# Patient Record
Sex: Female | Born: 1940 | Race: Black or African American | Hispanic: No | Smoking: Never smoker
Health system: Southern US, Community
[De-identification: ages and names within clinical notes are randomized; demographics above are authoritative.]

## PROBLEM LIST (undated history)

## (undated) DIAGNOSIS — I10 Essential (primary) hypertension: Secondary | ICD-10-CM

## (undated) DIAGNOSIS — R519 Headache, unspecified: Secondary | ICD-10-CM

## (undated) DIAGNOSIS — E7800 Pure hypercholesterolemia, unspecified: Secondary | ICD-10-CM

## (undated) DIAGNOSIS — G43909 Migraine, unspecified, not intractable, without status migrainosus: Secondary | ICD-10-CM

## (undated) DIAGNOSIS — K219 Gastro-esophageal reflux disease without esophagitis: Secondary | ICD-10-CM

## (undated) DIAGNOSIS — M1990 Unspecified osteoarthritis, unspecified site: Secondary | ICD-10-CM

## (undated) HISTORY — DX: Pure hypercholesterolemia, unspecified: E78.00

## (undated) HISTORY — DX: Migraine, unspecified, not intractable, without status migrainosus: G43.909

## (undated) HISTORY — DX: Essential (primary) hypertension: I10

## (undated) HISTORY — PX: TUBAL LIGATION: SHX77

## (undated) HISTORY — PX: TOTAL SHOULDER ARTHROPLASTY: SHX126

## (undated) HISTORY — DX: Headache, unspecified: R51.9

## (undated) HISTORY — DX: Gastro-esophageal reflux disease without esophagitis: K21.9

## (undated) HISTORY — DX: Unspecified osteoarthritis, unspecified site: M19.90

---

## 2015-06-05 DIAGNOSIS — I10 Essential (primary) hypertension: Secondary | ICD-10-CM | POA: Diagnosis present

## 2015-06-05 DIAGNOSIS — Z8673 Personal history of transient ischemic attack (TIA), and cerebral infarction without residual deficits: Secondary | ICD-10-CM | POA: Insufficient documentation

## 2015-06-05 DIAGNOSIS — E7800 Pure hypercholesterolemia, unspecified: Secondary | ICD-10-CM | POA: Insufficient documentation

## 2016-10-25 DIAGNOSIS — G4730 Sleep apnea, unspecified: Secondary | ICD-10-CM | POA: Insufficient documentation

## 2016-10-25 DIAGNOSIS — M1990 Unspecified osteoarthritis, unspecified site: Secondary | ICD-10-CM | POA: Insufficient documentation

## 2017-11-28 ENCOUNTER — Encounter: Payer: Self-pay | Admitting: Neurology

## 2018-02-03 NOTE — Progress Notes (Signed)
NEUROLOGY CONSULTATION NOTE  Destiny Barajas MRN: [id number] DOB: Jul 07, 1940  Referring provider: Craig StaggersSabitha Vasireddy, MD Primary care provider: Craig StaggersSabitha Vasireddy, MD  Reason for consult:  headaches  HISTORY OF PRESENT ILLNESS: Destiny Barajas is a 77 year old right-handed female with hypertension, OSA, hyperlipidemia, degenerative joint disease, ISG and history of TIA and right shoulder joint replacement who presents for headaches.  History supplemented by referring provider note.  Onset: She had similar headache back in 2011 or 2012.  At the time, she was evaluated in the ED.  CT of head was unremarkable.  They subsequently resolved.  Currently they started in March or April.  She was seen in the ED in May for headaches.  She has had some sinus congestion and has been taking Flonase and Zyrtec. Location:  Band-like distribution radiating down the neck bilaterally but may be unilateral either side Quality:  Pressure or throbbing Intensity:  Usually 6-7/10 (rarely up to 10/10).  She denies new headache, thunderclap headache or severe headache that wakes her from sleep. Aura:  no Prodrome:  no Postdrome:  no Associated symptoms:  None.  She denies associated nausea, vomiting, photophobia, phonophobia, visual disturbance or unilateral numbness or weakness. Duration:  4 hours Frequency:  Almost daily Frequency of abortive medication: 1 to 2 times a week Triggers:  unknown Relieving factors:  When preoccupied and thinking about something else Activity:  Able to function  Current NSAIDS: none Current analgesics:  Tylenol Current triptans:  none Current ergotamine:  none Current anti-emetic:  none Current muscle relaxants:  none Current anti-anxiolytic:  none Current sleep aide:  none Current Antihypertensive medications: Amlodipine 10 mg, valsartan-hydrochlorothiazide Current Antidepressant medications: None Current Anticonvulsant medications: None Current anti-CGRP: None Current  Vitamins/Herbal/Supplements: Vitamin D, calcium Current Antihistamines/Decongestants: Flonase, Zyrtec Other therapy: None  Past NSAIDS:  ASA 81mg  daily Past analgesics:  Tramadol (helped), oxycodone Past abortive triptans:  none Past abortive ergotamine:  none Past muscle relaxants:  none Past anti-emetic: Compazine, promethazine Past antihypertensive medications:  none Past antidepressant medications:  none Past anticonvulsant medications:  none Past anti-CGRP:  none Past vitamins/Herbal/Supplements:  none Past antihistamines/decongestants:  none Other past therapies:  none  Caffeine:  1 cup of coffee not daily Diet:  Not hydrating enough Exercise: No Depression: No; Anxiety: No Other pain: Right shoulder pain, pain in the knees, neck pain Sleep hygiene: Good Family history of headache:  Sister (migraines)  11/13/2017 labs: CBC with WBC 5.3, HMG 12.7, HCT 38.8, and PLT 211; CMP with NA 144, K4.7, CL 106, CO2 30, BUN 18, CR 1.04, T bili 0.6, ALP 71, AST 19, and ALT 9.  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 vitamin C (ASCORBIC ACID) 500 MG tablet Take 500 mg by mouth daily.  . Vitamin D, Ergocalciferol, (DRISDOL) 1.25 MG (50000 UT) CAPS capsule Take 50,000 Units by mouth every 7 (seven) days.  Marland Kitchen. amLODipine (NORVASC) 10 MG tablet Take 10 mg by mouth daily.  Marland Kitchen. atorvastatin (LIPITOR) 40 MG tablet Take 40 mg by mouth daily.  . valsartan-hydrochlorothiazide (DIOVAN-HCT) 160-12.5 MG tablet Take 1 tablet by mouth daily.   No facility-administered encounter medications on file as of 02/04/2018.     ALLERGIES: Compazine Promethazine  FAMILY HISTORY: Mother: CKD,  hypertension, breast cancer, stroke, dementia Father:  Hypertension, ischemic heart disease Brother: Ischemic heart disease  SOCIAL HISTORY: Marital status: Divorced Currently lives with her daughter Retired No history of tobacco, alcohol or recreational drug use  REVIEW OF SYSTEMS: Constitutional: No fevers, chills, or sweats, no generalized fatigue, change in appetite Eyes: No visual changes, double vision, eye pain Ear, nose and throat: No hearing loss, ear pain, nasal congestion, sore throat Cardiovascular: No chest pain, palpitations Respiratory:  No shortness of breath at rest or with exertion, wheezes GastrointestinaI: No nausea, vomiting, diarrhea, abdominal pain, fecal incontinence Genitourinary:  No dysuria, urinary retention or frequency Musculoskeletal: Neck pain Integumentary: No rash, pruritus, skin lesions Neurological: as above Psychiatric: No depression, insomnia, anxiety Endocrine: No palpitations, fatigue, diaphoresis, mood swings, change in appetite, change in weight, increased thirst Hematologic/Lymphatic:  No purpura, petechiae. Allergic/Immunologic: no itchy/runny eyes, nasal congestion, recent allergic reactions, rashes  PHYSICAL EXAM: Blood pressure 128/80, pulse 64, height 5' (1.524 m), weight 219 lb (99.3 kg), SpO2 92 %. General: No acute distress.  Patient appears well-groomed.  Head:  Normocephalic/atraumatic Eyes:  fundi examined but not visualized Neck: supple, mild bilateral paraspinal tenderness, full range of motion Back: No paraspinal tenderness Heart: regular rate and rhythm Lungs: Clear to auscultation bilaterally. Vascular: No carotid bruits. Neurological Exam: Mental status: alert and oriented to person, place, and time, recent and remote memory intact, fund of knowledge intact, attention and concentration intact, speech fluent and not dysarthric, language intact. Cranial nerves: CN I: not tested CN II: pupils equal, round and reactive to  light, visual fields intact CN III, IV, VI:  full range of motion, no nystagmus, no ptosis CN V: facial sensation intact CN VII: upper and lower face symmetric CN VIII: hearing intact CN IX, X: gag intact, uvula midline CN XI: sternocleidomastoid and trapezius muscles intact CN XII: tongue midline Bulk & Tone: normal, no fasciculations. Motor:  5/5 throughout  Sensation: temperature and vibration sensation intact. Deep Tendon Reflexes:  1+ throughout, toes downgoing.  Finger to nose testing:  Without dysmetria.  Heel to shin:  Without dysmetria.  Gait:  Normal station and stride.  Romberg negative.  IMPRESSION: Chronic tension-type headache, not intractable.  As she has prior history of these headaches with reportedly normal head CT, neurologic exam is unremarkable, semiology classic and they have currently been ongoing for several months, I don't feel further imaging is warranted at the time.    PLAN: 1.  For preventative management, she will start nortriptyline 10 mg at bedtime.  We can increase dose to 25 mg at bedtime in 4 weeks if needed.  Side effects discussed. 2.  For abortive therapy, she will try acetaminophen-caffeine. 3.  Limit use of pain relievers to no more than 2 days out of week to prevent risk of rebound or medication-overuse headache. 4.  Keep headache diary 5.  Exercise, hydration, caffeine cessation, sleep hygiene, monitor for and avoid triggers 6.  Consider:  magnesium citrate 400mg  daily, riboflavin 400mg  daily, and coenzyme Q10 100mg  three times daily 7. Suspicion is low given description of her headaches and prior history, but given her age and arthritis, we will check sed rate to evaluate for possible temporal arteritis. 8. Follow up in 3 to 4 months.  Thank you for allowing me to take part in the care of this patient.  Shon Millet, DO  CC: Craig Staggers, MD

## 2018-02-04 ENCOUNTER — Encounter

## 2018-02-04 ENCOUNTER — Encounter: Payer: Self-pay | Admitting: Neurology

## 2018-02-04 ENCOUNTER — Ambulatory Visit: Payer: Managed Care, Other (non HMO) | Admitting: Neurology

## 2018-02-04 ENCOUNTER — Other Ambulatory Visit (INDEPENDENT_AMBULATORY_CARE_PROVIDER_SITE_OTHER): Payer: Managed Care, Other (non HMO)

## 2018-02-04 VITALS — BP 128/80 | HR 64 | Ht 60.0 in | Wt 219.0 lb

## 2018-02-04 DIAGNOSIS — R6889 Other general symptoms and signs: Secondary | ICD-10-CM

## 2018-02-04 DIAGNOSIS — G44229 Chronic tension-type headache, not intractable: Secondary | ICD-10-CM | POA: Diagnosis not present

## 2018-02-04 LAB — SEDIMENTATION RATE: Sed Rate: 38 mm/hr — ABNORMAL HIGH (ref 0–30)

## 2018-02-04 MED ORDER — NORTRIPTYLINE HCL 10 MG PO CAPS: 10.0000 mg | ORAL_CAPSULE | Freq: Every day | ORAL | 3 refills | Status: DC

## 2018-02-04 NOTE — Patient Instructions (Addendum)
  1.  Start nortriptyline 10mg  at bedtime.  Contact me in 4 weeks with update and we can adjust dose if needed. 2.  Take acetaminophen-caffeine (Excedrin Tension) at earliest onset of headache.   3.  Limit use of pain relievers to no more than 2 days out of the week.  These medications include acetaminophen, ibuprofen, triptans and narcotics.  This will help reduce risk of rebound headaches. 4.  Be aware of common food triggers such as processed sweets, processed foods with nitrites (such as deli meat, hot dogs, sausages), foods with MSG, alcohol (such as wine), chocolate, certain cheeses, certain fruits (dried fruits, bananas, pineapple), vinegar, diet soda. 4.  Avoid caffeine 5.  Routine exercise 6.  Proper sleep hygiene 7.  Stay adequately hydrated with water 8.  Keep a headache diary. 9.  Maintain proper stress management. 10.  Do not skip meals. 11.  Consider supplements:  Magnesium citrate 400mg  to 600mg  daily, riboflavin 400mg , Coenzyme Q 10 100mg  three times daily 12.  Will check sed rate 13.  Follow up in 3 to 4 months.  Your provider has requested that you have labwork completed today. Please go to [facility] ([id number]) on the second floor of this building before leaving the office today. You do not need to check in. If you are not called within 15 minutes please check with the front desk.

## 2018-02-05 ENCOUNTER — Telehealth: Payer: Self-pay

## 2018-02-05 NOTE — Telephone Encounter (Signed)
-----   Message from Drema DallasAdam R Jaffe, DO sent at 02/04/2018  6:27 PM EST ----- Sed rate just mildly elevated but nothing significant or concerning

## 2018-02-05 NOTE — Telephone Encounter (Signed)
Called and advised Pt of lab results 

## 2018-02-27 ENCOUNTER — Other Ambulatory Visit: Payer: Self-pay | Admitting: Neurology

## 2018-06-09 ENCOUNTER — Ambulatory Visit: Payer: Managed Care, Other (non HMO) | Admitting: Neurology

## 2018-08-14 NOTE — Progress Notes (Signed)
Virtual Visit via Video Note The purpose of this virtual visit is to provide medical care while limiting exposure to the novel coronavirus.    Consent was obtained for video visit:  Yes.   Answered questions that patient had about telehealth interaction:  Yes.   I discussed the limitations, risks, security and privacy concerns of performing an evaluation and management service by telemedicine. I also discussed with the patient that there may be a patient responsible charge related to this service. The patient expressed understanding and agreed to proceed.  Pt location: Home Physician Location: Home Name of referring provider:  Dairl Ponder, MD I connected with Destiny Barajas at patients initiation/request on 08/17/2018 at 10:50 AM EDT by video enabled telemedicine application and verified that I am speaking with the correct person using two identifiers. Pt MRN:  [id number] Pt DOB:  August 16, 1940 Video Participants:  Destiny Barajas   History of Present Illness:  Destiny Barajas is a 78 year old right-handed female with hypertension, OSA, hyperlipidemia, degenerative joint disease, ISG and history of TIA and right shoulder joint replacement who follows up for headaches.  UPDATE: Sed rate from 02/04/18 was 38. She has not started nortriptyline because she read about possible suicidal ideation and was afraid. She said she went to an Urgent Care (not in system) and had a head CT that was reportedly unremarkable.  She was prescribed Fioricet.  They were ineffective.  Intensity:  Moderate-severe Duration:  4 to 5 hours Frequency:  4 days a week Frequency of abortive medication: 3 days a week Current NSAIDS: Excedrin Migraine (helps), Advil Migraine Current analgesics:  Fioricet Current triptans:  none Current ergotamine:  none Current anti-emetic:  none Current muscle relaxants:  none Current anti-anxiolytic:  none Current sleep aide:  none Current Antihypertensive medications:  Amlodipine 10 mg, valsartan-hydrochlorothiazide Current Antidepressant medications: Nortriptyline 10mg  at bedtime Current Anticonvulsant medications: None Current anti-CGRP: None Current Vitamins/Herbal/Supplements: Vitamin D, calcium Current Antihistamines/Decongestants: Flonase, Zyrtec Other therapy: None  Caffeine:  1 cup of coffee not daily Diet:  Not hydrating enough Exercise: No Depression: No; Anxiety: No Other pain: Right shoulder pain, pain in the knees, neck pain Sleep hygiene: Good  HISTORY: Onset: She had similar headache back in 2011 or 2012.  At the time, she was evaluated in the ED.  CT of head was unremarkable.  They subsequently resolved.  Currently they started in March or April.  She was seen in the ED in May for headaches.  She has had some sinus congestion and has been taking Flonase and Zyrtec. Location:  Band-like distribution radiating down the neck bilaterally but may be unilateral either side Quality:  Pressure or throbbing Initial intensity:  Usually 6-7/10 (rarely up to 10/10).  She denies new headache, thunderclap headache or severe headache that wakes her from sleep. Aura:  no Prodrome:  no Postdrome:  no Associated symptoms:  None.  She denies associated nausea, vomiting, photophobia, phonophobia, visual disturbance or unilateral numbness or weakness. Initial Duration:  4 hours Initial Frequency:  Almost daily Initial Frequency of abortive medication: 1 to 2 times a week Triggers:  Unknown Relieving factors:  When preoccupied and thinking about something else Activity:  Able to function  Past NSAIDS:  ASA 81mg  daily Past analgesics:  Tramadol (helped), oxycodone, Excedrin Past abortive triptans:  none Past abortive ergotamine:  none Past muscle relaxants:  none Past anti-emetic: Compazine, promethazine Past antihypertensive medications:  none Past antidepressant medications:  none Past anticonvulsant medications:  none Past anti-CGRP:  none  Past vitamins/Herbal/Supplements:  none Past antihistamines/decongestants:  none Other past therapies:  none  Family history of headache:  Sister (migraines)  Past Medical History: No past medical history on file.  Medications: Outpatient Encounter Medications as of 08/17/2018  Medication Sig  . amLODipine (NORVASC) 10 MG tablet Take 10 mg by mouth daily.  Marland Kitchen. atorvastatin (LIPITOR) 40 MG tablet Take 40 mg by mouth daily.  . nortriptyline (PAMELOR) 10 MG capsule TAKE 1 CAPSULE BY MOUTH EVERYDAY AT BEDTIME  . valsartan-hydrochlorothiazide (DIOVAN-HCT) 160-12.5 MG tablet Take 1 tablet by mouth daily.  . vitamin C (ASCORBIC ACID) 500 MG tablet Take 500 mg by mouth daily.  . Vitamin D, Ergocalciferol, (DRISDOL) 1.25 MG (50000 UT) CAPS capsule Take 50,000 Units by mouth every 7 (seven) days.   No facility-administered encounter medications on file as of 08/17/2018.     Allergies: Allergies  Allergen Reactions  . Compazine  [Prochlorperazine Edisylate]     Other reaction(s): Respiratory distress    Family History: Family History  Problem Relation Age of Onset  . Congestive Heart Failure Mother   . Breast cancer Mother   . Heart attack Father   . Brain cancer Brother   . Stomach cancer Brother   . High Cholesterol Brother   . Hypertension Brother   . Epilepsy Daughter   . Cerebral palsy Daughter   . Hypertension Daughter   . Diabetes Daughter   . Gout Son   . Hypertension Son   . High Cholesterol Son   . High Cholesterol Son     Social History: Social History   Socioeconomic History  . Marital status: Divorced    Spouse name: Not on file  . Number of children: 7  . Years of education: Not on file  . Highest education level: 11th grade  Occupational History  . Occupation: retired  Engineer, productionocial Needs  . Financial resource strain: Not on file  . Food insecurity    Worry: Not on file    Inability: Not on file  . Transportation needs    Medical: Not on file     Non-medical: Not on file  Tobacco Use  . Smoking status: Never Smoker  . Smokeless tobacco: Never Used  Substance and Sexual Activity  . Alcohol use: Never    Frequency: Never  . Drug use: Never  . Sexual activity: Not on file  Lifestyle  . Physical activity    Days per week: Not on file    Minutes per session: Not on file  . Stress: Not on file  Relationships  . Social Musicianconnections    Talks on phone: Not on file    Gets together: Not on file    Attends religious service: Not on file    Active member of club or organization: Not on file    Attends meetings of clubs or organizations: Not on file    Relationship status: Not on file  . Intimate partner violence    Fear of current or ex partner: Not on file    Emotionally abused: Not on file    Physically abused: Not on file    Forced sexual activity: Not on file  Other Topics Concern  . Not on file  Social History Narrative   Patient is right-handed. She lives in a one level home. She drinks one cup of coffee when it is cold weather, and an occasional tea or soda. She does not exercise due to pain in her knees.    Observations/Objective:  Height 5' (1.524 m), weight 225 lb (102.1 kg). No acute distress.  Alert and oriented.  Speech fluent and not dysarthric.  Language intact.  Face symmetric.  Assessment and Plan:   Questionable tension-type headache, not intractable.  Borderline elevated Sed rate suggests against temporal arteritis.  However, as these frequent headaches persist for the past 3 months, further imaging is warranted.  1. MRI of brain with and without contrast  2. For preventative management, start gabapentin 100mg  at bedtime for one week, then 100mg  twice daily for one week, then 100mg  in AM and 200mg  at bedtime for one week, then 200mg  twice daily 3.  For abortive therapy, stop Fioricet.  Continue Advil or Excedrin 4.  Limit use of pain relievers to no more than 2 days out of week to prevent risk of rebound or  medication-overuse headache. 5.  Keep headache diary 6.  Exercise, hydration, caffeine cessation, sleep hygiene, monitor for and avoid triggers 7.  Consider:  magnesium citrate 400mg  daily, riboflavin 400mg  daily, and coenzyme Q10 100mg  three times daily 8. Always keep in mind that currently taking a hormone or birth control may be a possible trigger or aggravating factor for migraine. 9. Follow up 4 months  Follow Up Instructions:    -I discussed the assessment and treatment plan with the patient. The patient was provided an opportunity to ask questions and all were answered. The patient agreed with the plan and demonstrated an understanding of the instructions.   The patient was advised to call back or seek an in-person evaluation if the symptoms worsen or if the condition fails to improve as anticipated.    Cira ServantAdam Robert Miranda Garber, DO

## 2018-08-17 ENCOUNTER — Encounter: Payer: Self-pay | Admitting: Neurology

## 2018-08-17 ENCOUNTER — Telehealth (INDEPENDENT_AMBULATORY_CARE_PROVIDER_SITE_OTHER): Payer: Medicare HMO | Admitting: Neurology

## 2018-08-17 ENCOUNTER — Other Ambulatory Visit: Payer: Self-pay

## 2018-08-17 VITALS — Ht 60.0 in | Wt 225.0 lb

## 2018-08-17 DIAGNOSIS — R519 Headache, unspecified: Secondary | ICD-10-CM

## 2018-08-17 DIAGNOSIS — R51 Headache: Secondary | ICD-10-CM | POA: Diagnosis not present

## 2018-08-17 MED ORDER — GABAPENTIN 100 MG PO CAPS: ORAL_CAPSULE | ORAL | 0 refills | Status: DC

## 2018-08-17 NOTE — Addendum Note (Signed)
Addended by: Clois Comber on: 08/17/2018 11:09 AM   Modules accepted: Orders

## 2018-08-18 ENCOUNTER — Ambulatory Visit: Payer: Managed Care, Other (non HMO) | Admitting: Neurology

## 2018-08-19 ENCOUNTER — Telehealth: Payer: Self-pay | Admitting: Neurology

## 2018-08-19 NOTE — Telephone Encounter (Signed)
Pt states that her insurance will only pay for 3 pills a day and he wanted her to take 4 a day. She states that she will need prior auth and her insurance states if they dont hear anything from [place] with 72 hours then the claim will be denied

## 2018-08-19 NOTE — Telephone Encounter (Signed)
Called Pt [phone] to confirm it is gabapentin she is calling about. She said she is willing to stay at a lower dose if it is not covered.   Called [employer] [facility] spoke with Hayden. Gave her clinical information. Approved PA# [id number] 02/18/18 - 02/18/19  Called and advised Pt of approval.

## 2018-09-08 ENCOUNTER — Ambulatory Visit
Admission: RE | Admit: 2018-09-08 | Discharge: 2018-09-08 | Disposition: A | Discharge status: Home / Self Care | Payer: Medicare HMO | Source: Ambulatory Visit | Attending: Neurology | Admitting: Neurology

## 2018-09-08 ENCOUNTER — Inpatient Hospital Stay: Admission: RE | Admit: 2018-09-08 | Payer: Medicare HMO | Source: Ambulatory Visit

## 2018-09-08 ENCOUNTER — Other Ambulatory Visit: Payer: Self-pay

## 2018-09-08 DIAGNOSIS — R519 Headache, unspecified: Secondary | ICD-10-CM

## 2018-09-08 MED ORDER — GADOPENTETATE DIMEGLUMINE 469.01 MG/ML IV SOLN
20.0000 mL | Freq: Once | INTRAVENOUS | Status: AC | PRN
  Administered 2018-09-08: 20 mL via INTRAVENOUS

## 2018-09-11 ENCOUNTER — Other Ambulatory Visit: Payer: Self-pay | Admitting: Neurology

## 2018-09-15 ENCOUNTER — Telehealth: Payer: Self-pay

## 2018-09-15 NOTE — Telephone Encounter (Signed)
-----   Message from Jesse Fall, RN sent at 09/15/2018 10:02 AM EDT -----  ----- Message ----- From: Pieter Partridge, DO Sent: 09/09/2018  12:59 PM EDT To: Clois Comber, CMA  MRI shows no tumors or anything specific to be causing headache.  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  Cannot determine when it occurred.  However, based on this finding, her age and her comorbidities (high blood pressure, cholesterol), I would start ASA 81mg  daily.

## 2018-09-15 NOTE — Telephone Encounter (Signed)
Called spoke with patient she was informed of results. Pt is currently taken 81 MG of ASA.

## 2018-10-29 NOTE — Progress Notes (Signed)
NEUROLOGY FOLLOW UP OFFICE NOTE  Destiny Barajas [id number]  HISTORY OF PRESENT ILLNESS: Destiny Barajas is a 40 year oldright-handed femalewith hypertension, OSA, hyperlipidemia, degenerative joint disease, ISG and history of TIA and right shoulder joint replacementwho follows up for headaches.  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  No mass lesions.  She was advised to start ASA 81mg  daily.  Intensity:  Moderate-severe Duration:  4 to 5 hours Frequency:  4 days a week Frequency of abortive medication: 3 days a week Current NSAIDS:Excedrin Migraine (helps), Advil Migraine, ASA 81mg  daily Current analgesics:none Current triptans:none Current ergotamine:none Current anti-emetic:none Current muscle relaxants:none Current anti-anxiolytic:none Current sleep aide:none Current Antihypertensive medications:Amlodipine 10 mg, valsartan-hydrochlorothiazide Current Antidepressant medications:None Current Anticonvulsant medications:Gabapentin 200mg  twice daily Current anti-CGRP:None Current Vitamins/Herbal/Supplements:Vitamin D, calcium Current Antihistamines/Decongestants:Flonase, Zyrtec Other therapy:None  Caffeine:1 cup of coffee not daily Diet:Not hydrating enough Exercise:No Depression:No; Anxiety:No Other pain:Right shoulder pain, pain in the knees, neck pain.  In the morning, she has soreness in the neck and shoulders that that improves with neck movement. Sleep hygiene:Good  HISTORY: Onset:She had similar headache back in 2011 or 2012. At the time, she was evaluated in the ED. CT of head was unremarkable. They subsequently resolved. They returned in March or April 2019. She was seen in the EDin Mayfor  headaches. She has had some sinus congestion and had been taking Flonase and Zyrtec.  Sed rate from 02/04/18 was 38. Location:Band-like distribution radiating down the neck bilaterally but may be unilateral either side Quality:Pressure or throbbing, she has reported shooting up the up to the jaw. Initial intensity:Usually 6-7/10 (rarely up to 10/10).Shedenies thunderclap headache or severe headache that wakes herfrom sleep. Aura:no Prodrome:no Postdrome:no Associated symptoms:  No associated symptoms.Shedenies associated nausea, vomiting, photophobia, phonophobia, visual disturbance orunilateral numbness or weakness. Initial Duration:4 hours Initial Frequency:Almost daily Initial Frequency of abortive medication:1 to 2 times a week Triggers:  Unknown Relieving factors:When preoccupied and thinking about something else Activity:Able to function  Past NSAIDS:ASA 81mg  daily Past analgesics:Fioricet (ineffective), tramadol (helped), oxycodone, Excedrin Past abortive triptans:none Past abortive ergotamine:none Past muscle relaxants:none Past anti-emetic:Compazine, promethazine Past antihypertensive medications:none Past antidepressant medications:none.  She did not want to try nortriptyline due to possible side effect of suicidal ideation. Past anticonvulsant medications:none Past anti-CGRP:none Past vitamins/Herbal/Supplements:none Past antihistamines/decongestants:none Other past therapies:none  Family history of headache:Sister (migraines)  PAST MEDICAL HISTORY: Past Medical History:  Diagnosis Date  . Acid reflux   . Arthritis   . Headache   . Hypercholesteremia   . Hypertension   . Migraines     MEDICATIONS: Current Outpatient Medications on File Prior to Visit  Medication Sig Dispense Refill  . amLODipine (NORVASC) 10 MG tablet Take 10 mg by mouth daily.    Marland Kitchen atorvastatin (LIPITOR) 40 MG tablet Take 40 mg by  mouth daily.    Marland Kitchen gabapentin (NEURONTIN) 100 MG capsule 2 CAPSULES TWICE A DAY 360 capsule 1  . nortriptyline (PAMELOR) 10 MG capsule TAKE 1 CAPSULE BY MOUTH EVERYDAY AT BEDTIME (Patient not taking: Reported on 08/17/2018) 90 capsule 2  . valsartan-hydrochlorothiazide (DIOVAN-HCT) 160-12.5 MG tablet Take 1 tablet by mouth daily.    . vitamin C (ASCORBIC ACID) 500 MG tablet Take 500 mg by mouth daily.    . Vitamin D, Ergocalciferol, (DRISDOL) 1.25 MG (50000 UT)  CAPS capsule Take 50,000 Units by mouth every 7 (seven) days.     No current facility-administered medications on file prior to visit.     ALLERGIES: Allergies  Allergen Reactions  . Prochlorperazine Edisylate Shortness Of Breath    Other reaction(s): Respiratory distress    FAMILY HISTORY: Family History  Problem Relation Age of Onset  . Congestive Heart Failure Mother   . Breast cancer Mother   . Heart attack Father   . Brain cancer Brother   . Stomach cancer Brother   . High Cholesterol Brother   . Hypertension Brother   . Epilepsy Daughter   . Cerebral palsy Daughter   . Hypertension Daughter   . Diabetes Daughter   . Gout Son   . Hypertension Son   . High Cholesterol Son   . High Cholesterol Son    SOCIAL HISTORY: Social History   Socioeconomic History  . Marital status: Divorced    Spouse name: Not on file  . Number of children: 7  . Years of education: Not on file  . Highest education level: 11th grade  Occupational History  . Occupation: retired  Engineer, productionocial Needs  . Financial resource strain: Not on file  . Food insecurity    Worry: Not on file    Inability: Not on file  . Transportation needs    Medical: Not on file    Non-medical: Not on file  Tobacco Use  . Smoking status: Never Smoker  . Smokeless tobacco: Never Used  Substance and Sexual Activity  . Alcohol use: Never    Frequency: Never  . Drug use: Never  . Sexual activity: Not on file  Lifestyle  . Physical activity    Days per week:  Not on file    Minutes per session: Not on file  . Stress: Not on file  Relationships  . Social Musicianconnections    Talks on phone: Not on file    Gets together: Not on file    Attends religious service: Not on file    Active member of club or organization: Not on file    Attends meetings of clubs or organizations: Not on file    Relationship status: Not on file  . Intimate partner violence    Fear of current or ex partner: Not on file    Emotionally abused: Not on file    Physically abused: Not on file    Forced sexual activity: Not on file  Other Topics Concern  . Not on file  Social History Narrative   Patient is right-handed. She lives in a one level home. She drinks one cup of coffee when it is cold weather, and an occasional tea or soda. She does not exercise due to pain in her knees.    REVIEW OF SYSTEMS: Constitutional: No fevers, chills, or sweats, no generalized fatigue, change in appetite Eyes: No visual changes, double vision, eye pain Ear, nose and throat: No hearing loss, ear pain, nasal congestion, sore throat Cardiovascular: No chest pain, palpitations Respiratory:  No shortness of breath at rest or with exertion, wheezes GastrointestinaI: No nausea, vomiting, diarrhea, abdominal pain, fecal incontinence Genitourinary:  No dysuria, urinary retention or frequency Musculoskeletal:  No neck pain, back pain Integumentary: No rash, pruritus, skin lesions Neurological: as above Psychiatric: No depression, insomnia, anxiety Endocrine: No palpitations, fatigue, diaphoresis, mood swings, change in appetite, change in weight, increased thirst Hematologic/Lymphatic:  No purpura, petechiae. Allergic/Immunologic: no itchy/runny eyes, nasal congestion, recent allergic reactions, rashes  PHYSICAL  EXAM: Blood pressure (!) 147/80, pulse (!) 56, temperature 97.6 F (36.4 C), height 5' (1.524 m), weight 228 lb (103.4 kg), SpO2 96 %. General: No acute distress.  Patient appears  well-groomed.   Head:  Normocephalic/atraumatic Eyes:  Fundi examined but not visualized Neck: supple, paraspinal tenderness, full range of motion Heart:  Regular rate and rhythm Lungs:  Clear to auscultation bilaterally Back: No paraspinal tenderness Neurological Exam: alert and oriented to person, place, and time. Attention span and concentration intact, recent and remote memory intact, fund of knowledge intact.  Speech fluent and not dysarthric, language intact.  CN II-XII intact. Bulk and tone normal, muscle strength 5/5 throughout.  Sensation to light touch  intact.  Deep tendon reflexes 2+ throughout.  Finger to nose testing intact.  Gait normal, Romberg negative.  IMPRESSION: Chronic headache, not intractable.  Unclear etiology.  May be tension-type or cervicogenic   PLAN: 1.  Due to severity of and persistence of pain, will check MRI of cervical spine 2.  Increase gabapentin to 300mg  twice daily 3.  Follow up in 4 months.  Shon Millet, DO  CC: Craig Staggers, MD

## 2018-10-30 ENCOUNTER — Encounter: Payer: Self-pay | Admitting: Neurology

## 2018-10-30 ENCOUNTER — Other Ambulatory Visit: Payer: Self-pay

## 2018-10-30 ENCOUNTER — Ambulatory Visit: Payer: Medicare HMO | Admitting: Neurology

## 2018-10-30 VITALS — BP 147/80 | HR 56 | Temp 97.6°F | Ht 60.0 in | Wt 228.0 lb

## 2018-10-30 DIAGNOSIS — R519 Headache, unspecified: Secondary | ICD-10-CM

## 2018-10-30 DIAGNOSIS — M542 Cervicalgia: Secondary | ICD-10-CM

## 2018-10-30 DIAGNOSIS — R51 Headache: Secondary | ICD-10-CM

## 2018-10-30 NOTE — Patient Instructions (Signed)
1.  We will increase dose of gabapentin 100mg  capsules:  Take 3 capsules twice daily.  Contact me for refill and I will prescribe 300mg  capsules.  We can still increase dose if needed after 4 to 6 weeks. 2.  We will order MRI of cervical spine without contrast 3.  Follow up in 4 months.

## 2018-11-15 ENCOUNTER — Other Ambulatory Visit: Payer: Self-pay

## 2018-11-15 ENCOUNTER — Ambulatory Visit
Admission: RE | Admit: 2018-11-15 | Discharge: 2018-11-15 | Disposition: A | Discharge status: Home / Self Care | Payer: Medicare HMO | Source: Ambulatory Visit | Attending: Neurology | Admitting: Neurology

## 2018-11-15 DIAGNOSIS — R519 Headache, unspecified: Secondary | ICD-10-CM

## 2018-11-15 DIAGNOSIS — M542 Cervicalgia: Secondary | ICD-10-CM

## 2018-11-30 ENCOUNTER — Telehealth: Payer: Self-pay | Admitting: *Deleted

## 2018-11-30 NOTE — Telephone Encounter (Signed)
Patient called and had not heard results of her MRI cervical spine.   Per MD notes :  Notes recorded by Pieter Partridge, DO on 11/16/2018 at 10:34 AM EDT    MRI of cervical spine shows some arthritic changes. No findings that require any surgical intervention or anything like that.  Read result note above to patient and she verbalized understanding.

## 2018-12-29 ENCOUNTER — Ambulatory Visit: Payer: Medicare HMO | Admitting: Neurology

## 2019-02-26 ENCOUNTER — Encounter: Payer: Self-pay | Admitting: Neurology

## 2019-03-01 NOTE — Progress Notes (Signed)
Appointment was cancelled.

## 2019-03-02 ENCOUNTER — Telehealth (INDEPENDENT_AMBULATORY_CARE_PROVIDER_SITE_OTHER): Payer: Medicare HMO | Admitting: Neurology

## 2019-06-15 ENCOUNTER — Ambulatory Visit: Payer: Medicare HMO | Admitting: Neurology

## 2021-01-15 ENCOUNTER — Emergency Department (HOSPITAL_COMMUNITY): Payer: Medicare HMO

## 2021-01-15 ENCOUNTER — Encounter (HOSPITAL_COMMUNITY): Payer: Self-pay | Admitting: Emergency Medicine

## 2021-01-15 ENCOUNTER — Inpatient Hospital Stay (HOSPITAL_COMMUNITY)
Admission: EM | Admit: 2021-01-15 | Discharge: 2021-01-19 | DRG: 291 | Disposition: A | Discharge status: Home / Self Care | Payer: Medicare HMO | Attending: Internal Medicine | Admitting: Internal Medicine

## 2021-01-15 DIAGNOSIS — R0602 Shortness of breath: Secondary | ICD-10-CM | POA: Diagnosis present

## 2021-01-15 DIAGNOSIS — I5033 Acute on chronic diastolic (congestive) heart failure: Secondary | ICD-10-CM | POA: Diagnosis not present

## 2021-01-15 DIAGNOSIS — Z888 Allergy status to other drugs, medicaments and biological substances status: Secondary | ICD-10-CM

## 2021-01-15 DIAGNOSIS — I110 Hypertensive heart disease with heart failure: Secondary | ICD-10-CM | POA: Diagnosis not present

## 2021-01-15 DIAGNOSIS — I5031 Acute diastolic (congestive) heart failure: Secondary | ICD-10-CM

## 2021-01-15 DIAGNOSIS — Z87892 Personal history of anaphylaxis: Secondary | ICD-10-CM

## 2021-01-15 DIAGNOSIS — M7989 Other specified soft tissue disorders: Secondary | ICD-10-CM

## 2021-01-15 DIAGNOSIS — Z9851 Tubal ligation status: Secondary | ICD-10-CM

## 2021-01-15 DIAGNOSIS — Z79899 Other long term (current) drug therapy: Secondary | ICD-10-CM

## 2021-01-15 DIAGNOSIS — Z833 Family history of diabetes mellitus: Secondary | ICD-10-CM

## 2021-01-15 DIAGNOSIS — Z6841 Body Mass Index (BMI) 40.0 and over, adult: Secondary | ICD-10-CM

## 2021-01-15 DIAGNOSIS — E782 Mixed hyperlipidemia: Secondary | ICD-10-CM | POA: Diagnosis present

## 2021-01-15 DIAGNOSIS — K219 Gastro-esophageal reflux disease without esophagitis: Secondary | ICD-10-CM | POA: Diagnosis present

## 2021-01-15 DIAGNOSIS — Z20822 Contact with and (suspected) exposure to covid-19: Secondary | ICD-10-CM | POA: Diagnosis present

## 2021-01-15 DIAGNOSIS — Z8249 Family history of ischemic heart disease and other diseases of the circulatory system: Secondary | ICD-10-CM

## 2021-01-15 DIAGNOSIS — E1142 Type 2 diabetes mellitus with diabetic polyneuropathy: Secondary | ICD-10-CM | POA: Diagnosis present

## 2021-01-15 DIAGNOSIS — L409 Psoriasis, unspecified: Secondary | ICD-10-CM | POA: Diagnosis present

## 2021-01-15 DIAGNOSIS — Z8673 Personal history of transient ischemic attack (TIA), and cerebral infarction without residual deficits: Secondary | ICD-10-CM

## 2021-01-15 DIAGNOSIS — E66813 Obesity, class 3: Secondary | ICD-10-CM | POA: Diagnosis present

## 2021-01-15 DIAGNOSIS — I10 Essential (primary) hypertension: Secondary | ICD-10-CM | POA: Diagnosis present

## 2021-01-15 DIAGNOSIS — K449 Diaphragmatic hernia without obstruction or gangrene: Secondary | ICD-10-CM

## 2021-01-15 DIAGNOSIS — M1990 Unspecified osteoarthritis, unspecified site: Secondary | ICD-10-CM | POA: Diagnosis present

## 2021-01-15 DIAGNOSIS — R0600 Dyspnea, unspecified: Secondary | ICD-10-CM

## 2021-01-15 DIAGNOSIS — Z83438 Family history of other disorder of lipoprotein metabolism and other lipidemia: Secondary | ICD-10-CM

## 2021-01-15 DIAGNOSIS — R7989 Other specified abnormal findings of blood chemistry: Secondary | ICD-10-CM | POA: Diagnosis present

## 2021-01-15 DIAGNOSIS — G4733 Obstructive sleep apnea (adult) (pediatric): Secondary | ICD-10-CM | POA: Diagnosis present

## 2021-01-15 LAB — D-DIMER, QUANTITATIVE: D-Dimer, Quant: 3.18 ug/mL-FEU — ABNORMAL HIGH (ref 0.00–0.50)

## 2021-01-15 LAB — BLOOD GAS, VENOUS
Acid-Base Excess: 3.3 mmol/L — ABNORMAL HIGH (ref 0.0–2.0)
Bicarbonate: 25.7 mmol/L (ref 20.0–28.0)
Drawn by: 6381
FIO2: 21
O2 Saturation: 37.2 %
Patient temperature: 36.6
pCO2, Ven: 45.6 mmHg (ref 44.0–60.0)
pH, Ven: 7.4 (ref 7.250–7.430)
pO2, Ven: <31 mmHg — CL (ref 32.0–45.0)

## 2021-01-15 LAB — BASIC METABOLIC PANEL
Anion gap: 9 (ref 5–15)
BUN: 10 mg/dL (ref 8–23)
CO2: 27 mmol/L (ref 22–32)
Calcium: 9.6 mg/dL (ref 8.9–10.3)
Chloride: 106 mmol/L (ref 98–111)
Creatinine, Ser: 1.32 mg/dL — ABNORMAL HIGH (ref 0.44–1.00)
GFR, Estimated: 41 mL/min — ABNORMAL LOW (ref >60)
Glucose, Bld: 115 mg/dL — ABNORMAL HIGH (ref 70–99)
Potassium: 4.1 mmol/L (ref 3.5–5.1)
Sodium: 142 mmol/L (ref 135–145)

## 2021-01-15 LAB — CBC
HCT: 39 % (ref 36.0–46.0)
Hemoglobin: 12.1 g/dL (ref 12.0–15.0)
MCH: 28.7 pg (ref 26.0–34.0)
MCHC: 31 g/dL (ref 30.0–36.0)
MCV: 92.4 fL (ref 80.0–100.0)
Platelets: 224 10*3/uL (ref 150–400)
RBC: 4.22 MIL/uL (ref 3.87–5.11)
RDW: 15.7 % — ABNORMAL HIGH (ref 11.5–15.5)
WBC: 8.9 10*3/uL (ref 4.0–10.5)
nRBC: 0 % (ref 0.0–0.2)

## 2021-01-15 LAB — TROPONIN I (HIGH SENSITIVITY)
Troponin I (High Sensitivity): 11 ng/L (ref <18)
Troponin I (High Sensitivity): 10 ng/L (ref <18)

## 2021-01-15 LAB — RESP PANEL BY RT-PCR (FLU A&B, COVID) ARPGX2
Influenza A by PCR: NEGATIVE
Influenza B by PCR: NEGATIVE
SARS Coronavirus 2 by RT PCR: NEGATIVE

## 2021-01-15 LAB — BRAIN NATRIURETIC PEPTIDE: B Natriuretic Peptide: 79 pg/mL (ref 0.0–100.0)

## 2021-01-15 MED ORDER — IOHEXOL 350 MG/ML SOLN
100.0000 mL | Freq: Once | INTRAVENOUS | Status: AC | PRN
  Administered 2021-01-15: 21:00:00 100 mL via INTRAVENOUS

## 2021-01-15 MED ORDER — FUROSEMIDE 10 MG/ML IJ SOLN
40.0000 mg | Freq: Once | INTRAMUSCULAR | Status: DC
  Filled 2021-01-15: qty 4

## 2021-01-15 NOTE — ED Notes (Signed)
Pt with notable blood pressure discrepancy in right vs left arms. Dr.Goldston notified. Lasix held.

## 2021-01-15 NOTE — ED Notes (Signed)
Pt ambulated to bathroom on continuous pulse oximeter with sats maintaining 92-93% and HR 85-99bpm. On return to room pts sats dropped to 89% with RR 36-38bpm HR in 90s. Pt returned to 92-95% after 3-5 minutes but continues with increased work of breathing. Blood pressure discrepancy noted in left vs. right arm again. Dr. Criss Alvine aware.

## 2021-01-15 NOTE — ED Notes (Signed)
Report called to Tia RN on 300

## 2021-01-15 NOTE — ED Provider Notes (Signed)
[facility] EMERGENCY DEPARTMENT Provider Note   CSN: [id number] Arrival date & time: 01/15/21  1545     History Chief Complaint  Patient presents with   Shortness of Breath    Destiny Barajas is a 80 y.o. female.  HPI 80 year old female presents with shortness of breath.  Its been going on for about 2 weeks but worse over the last 4 days or so.  No cough, fever, chest pain.  She is been having bilateral lower extremity edema.  She was put on Lasix and feels like the swelling temporarily gets better but then gets worse again.  The shortness of breath is most prominent when she is ambulating and she gets winded quite easily.  She denies any known history of CHF.  Past Medical History:  Diagnosis Date   Acid reflux    Arthritis    Headache    Hypercholesteremia    Hypertension    Migraines     Patient Active Problem List   Diagnosis Date Noted   Shortness of breath 01/15/2021   Arthritis 10/25/2016   Sleep apnea 10/25/2016   High cholesterol 06/05/2015   History of TIA (transient ischemic attack) 06/05/2015   Hypertension 06/05/2015    Past Surgical History:  Procedure Laterality Date   TOTAL SHOULDER ARTHROPLASTY Right    TUBAL LIGATION       OB History   No obstetric history on file.     Family History  Problem Relation Age of Onset   Congestive Heart Failure Mother    Breast cancer Mother    Heart attack Father    Brain cancer Brother    Stomach cancer Brother    High Cholesterol Brother    Hypertension Brother    Epilepsy Daughter    Cerebral palsy Daughter    Hypertension Daughter    Diabetes Daughter    Gout Son    Hypertension Son    High Cholesterol Son    High Cholesterol Son     Social History   Tobacco Use   Smoking status: Never   Smokeless tobacco: Never  Vaping Use   Vaping Use: Never used  Substance Use Topics   Alcohol use: Never   Drug use: Never    Home Medications Prior to Admission medications   Medication Sig Start  Date End Date Taking? Authorizing Provider  amLODipine (NORVASC) 10 MG tablet Take 10 mg by mouth daily.   Yes [provider]  Apremilast (OTEZLA PO) Take 30 mg by mouth 2 (two) times daily.   Yes [provider]  atorvastatin (LIPITOR) 40 MG tablet Take 20 mg by mouth daily.   Yes [provider]  Biotin 93818 MCG TABS Take 1 tablet by mouth daily.   Yes [provider]  calcium carbonate (OS-CAL) 1250 (500 Ca) MG chewable tablet Chew 1 tablet by mouth daily. Not sure of dose but takes daily OTC calcium   Yes [provider]  cholecalciferol (VITAMIN D3) 25 MCG (1000 UT) tablet Take 1,000 Units by mouth daily.   Yes [provider]  Fluocinolone Acetonide Scalp 0.01 % OIL Apply topically at bedtime. 12/11/20  Yes [provider]  fluticasone (CUTIVATE) 0.05 % cream Apply 1 application topically daily. 12/29/20  Yes [provider]  furosemide (LASIX) 20 MG tablet Take 20 mg by mouth daily. 12/26/20  Yes [provider]  valsartan (DIOVAN) 160 MG tablet Take 160 mg by mouth daily. 09/01/18  Yes [provider]  vitamin C (  ASCORBIC ACID) 500 MG tablet Take 500 mg by mouth daily.   Yes [provider]  clobetasol cream (TEMOVATE) 0.05 % Apply topically. Patient not taking: Reported on 01/15/2021 12/18/20   [provider]  gabapentin (NEURONTIN) 100 MG capsule 2 CAPSULES TWICE A DAY Patient not taking: Reported on 02/26/2019 09/11/18   Drema Dallas, DO  hydrochlorothiazide (HYDRODIURIL) 12.5 MG tablet TAKE 1 TABLET BY MOUTH EVERY DAY IN THE MORNING Patient not taking: Reported on 01/15/2021 08/24/18   [provider]  ketoconazole (NIZORAL) 2 % shampoo Apply topically 2 (two) times a week. Patient not taking: Reported on 01/15/2021 11/06/20   [provider]  nortriptyline (PAMELOR) 10 MG capsule TAKE 1 CAPSULE BY MOUTH EVERYDAY AT BEDTIME Patient not taking: No sig reported  02/27/18   Drema Dallas, DO  triamcinolone ointment (KENALOG) 0.1 % Apply 1 application topically 2 (two) times daily. Patient not taking: Reported on 01/15/2021 11/29/20   [provider]    Allergies    Prochlorperazine and Prochlorperazine edisylate  Review of Systems   Review of Systems  Constitutional:  Negative for fever.  Respiratory:  Positive for shortness of breath. Negative for cough.   Cardiovascular:  Positive for leg swelling. Negative for chest pain.  Gastrointestinal:  Negative for abdominal pain.  All other systems reviewed and are negative.  Physical Exam Updated Vital Signs BP (!) 111/98 (BP Location: Left Arm)   Pulse 72   Temp 98.3 F (36.8 C)   Resp 20   SpO2 95%   Physical Exam Vitals and nursing note reviewed.  Constitutional:      Appearance: She is well-developed. She is obese.  HENT:     Head: Normocephalic and atraumatic.     Right Ear: External ear normal.     Left Ear: External ear normal.     Nose: Nose normal.  Eyes:     General:        Right eye: No discharge.        Left eye: No discharge.  Cardiovascular:     Rate and Rhythm: Normal rate and regular rhythm.     Heart sounds: Normal heart sounds.  Pulmonary:     Effort: Pulmonary effort is normal.     Breath sounds: Examination of the left-lower field reveals decreased breath sounds. Decreased breath sounds present.  Abdominal:     Palpations: Abdomen is soft.     Tenderness: There is no abdominal tenderness.  Musculoskeletal:     Right lower leg: Edema present.     Left lower leg: Edema present.  Skin:    General: Skin is warm and dry.  Neurological:     Mental Status: She is alert.  Psychiatric:        Mood and Affect: Mood is not anxious.    ED Results / Procedures / Treatments   Labs (all labs ordered are listed, but only abnormal results are displayed) Labs Reviewed  BASIC METABOLIC PANEL - Abnormal; Notable for the following components:      Result Value    Glucose, Bld 115 (*)    Creatinine, Ser 1.32 (*)    GFR, Estimated 41 (*)    All other components within normal limits  CBC - Abnormal; Notable for the following components:   RDW 15.7 (*)    All other components within normal limits  D-DIMER, QUANTITATIVE - Abnormal; Notable for the following components:   D-Dimer, Quant 3.18 (*)    All other components  within normal limits  BLOOD GAS, VENOUS - Abnormal; Notable for the following components:   pO2, Ven <31.0 (*)    Acid-Base Excess 3.3 (*)    All other components within normal limits  RESP PANEL BY RT-PCR (FLU A&B, COVID) ARPGX2  BRAIN NATRIURETIC PEPTIDE  TROPONIN I (HIGH SENSITIVITY)  TROPONIN I (HIGH SENSITIVITY)    EKG EKG Interpretation  Date/Time:  Monday January 15 2021 16:25:31 EST Ventricular Rate:  80 PR Interval:  154 QRS Duration: 78 QT Interval:  378 QTC Calculation: 435 R Axis:   36 Text Interpretation: Normal sinus rhythm nonspecific T waves No old tracing to compare Confirmed by Pricilla Loveless [phone]) on 01/15/2021 7:52:26 PM  Radiology DG Chest 2 View  Result Date: 01/15/2021 CLINICAL DATA:  Shortness of breath. EXAM: CHEST - 2 VIEW COMPARISON:  None. FINDINGS: There is a moderate-sized left diaphragmatic hernia and large hiatal hernia. There is no definite lung consolidation, pleural effusion or pneumothorax identified. Cardiomediastinal silhouette is grossly within normal limits. No acute fractures are seen. There is a right shoulder arthroplasty. IMPRESSION: 1. There is a moderate-sized left diaphragmatic hernia and large hiatal hernia. This may be contributing to patient's shortness of breath. 2. There is no definite focal lung infiltrate. Electronically Signed   By: Darliss Cheney M.D.   On: 01/15/2021 17:27   CT Angio Chest PE W and/or Wo Contrast  Result Date: 01/15/2021 CLINICAL DATA:  PE suspected positive D-dimer EXAM: CT ANGIOGRAPHY CHEST WITH CONTRAST TECHNIQUE: Multidetector CT imaging of  the chest was performed using the standard protocol during bolus administration of intravenous contrast. Multiplanar CT image reconstructions and MIPs were obtained to evaluate the vascular anatomy. CONTRAST:  OMNIPAQUE IOHEXOL 350 MG/ML SOLN COMPARISON:  None. FINDINGS: Cardiovascular: No filling defects within the pulmonary arteries to suggest acute pulmonary embolism. Suboptimal exam due to body habitus. Mediastinum/Nodes: No axillary or supraclavicular adenopathy. No mediastinal or hilar adenopathy. No pericardial fluid. Esophagus normal. Large diaphragmatic hernia with the near entirety of the stomach and the transverse colon above the hemidiaphragms posterior the heart. Lungs/Pleura: No pulmonary infarction. No pulmonary edema. Mild LEFT basilar atelectasis associated to the elevated hemidiaphragm. Upper Abdomen: Limited view of the liver, kidneys, pancreas are unremarkable. Normal adrenal glands. Large hiatal hernia as above. Musculoskeletal: No aggressive osseous lesion identified. Review of the MIP images confirms the above findings. IMPRESSION: 1. No acute pulmonary embolism. Suboptimal exam due to body habitus. 2. 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 Electronically Signed   By: Genevive Bi M.D.   On: 01/15/2021 21:44    Procedures Procedures   Medications Ordered in ED Medications  furosemide (LASIX) injection 40 mg (0 mg Intravenous Hold 01/15/21 2149)  iohexol (OMNIPAQUE) 350 MG/ML injection 100 mL (100 mLs Intravenous Contrast Given 01/15/21 2124)    ED Course  I have reviewed the triage vital signs and the nursing notes.  Pertinent labs & imaging results that were available during my care of the patient were reviewed by me and considered in my medical decision making (see chart for details).    MDM Rules/Calculators/A&P                           Unclear exact cause of dyspnea.  Workup shows no focal findings besides sizable hiatal hernia. Perhaps this is causing her dyspnea. She became mildly hypoxic with ambulation (89%) and was quite  symptomatic. Given this, will admit for further workup.  Final Clinical Impression(s) / ED Diagnoses Final diagnoses:  Dyspnea, unspecified type    Rx / DC Orders ED Discharge Orders     None        Pricilla Loveless, MD 01/16/21 [phone]

## 2021-01-15 NOTE — ED Notes (Signed)
Date and time results received: 01/15/21 2325 (use smartphrase ".now" to insert current time)  Test: PO2 Critical Value: <31  Name of Provider Notified: Dr. Leafy Half  Orders Received? Or Actions Taken?: no/na

## 2021-01-15 NOTE — ED Triage Notes (Signed)
Pt here from home with c/o sob , worse with movement , pt was just placed on lasix 1 week ago , no cp

## 2021-01-15 NOTE — ED Notes (Signed)
Pt ambulated in hallway by staff x2  O2 92-93% during ambulation HR 95-98

## 2021-01-16 ENCOUNTER — Observation Stay (HOSPITAL_COMMUNITY): Payer: Medicare HMO

## 2021-01-16 ENCOUNTER — Other Ambulatory Visit: Payer: Self-pay

## 2021-01-16 DIAGNOSIS — R0609 Other forms of dyspnea: Secondary | ICD-10-CM

## 2021-01-16 DIAGNOSIS — K219 Gastro-esophageal reflux disease without esophagitis: Secondary | ICD-10-CM

## 2021-01-16 DIAGNOSIS — R7989 Other specified abnormal findings of blood chemistry: Secondary | ICD-10-CM | POA: Diagnosis not present

## 2021-01-16 DIAGNOSIS — E782 Mixed hyperlipidemia: Secondary | ICD-10-CM

## 2021-01-16 DIAGNOSIS — R0602 Shortness of breath: Secondary | ICD-10-CM | POA: Diagnosis not present

## 2021-01-16 DIAGNOSIS — I10 Essential (primary) hypertension: Secondary | ICD-10-CM | POA: Diagnosis not present

## 2021-01-16 DIAGNOSIS — L409 Psoriasis, unspecified: Secondary | ICD-10-CM

## 2021-01-16 DIAGNOSIS — K449 Diaphragmatic hernia without obstruction or gangrene: Secondary | ICD-10-CM

## 2021-01-16 LAB — ECHOCARDIOGRAM COMPLETE
AR max vel: 2.12 cm2
AV Area VTI: 2.61 cm2
AV Area mean vel: 2.14 cm2
AV Mean grad: 7 mmHg
AV Peak grad: 16.3 mmHg
Ao pk vel: 2.02 m/s
Area-P 1/2: 2.81 cm2
Calc EF: 63.8 %
Height: 60 in
MV VTI: 2.4 cm2
S' Lateral: 3.1 cm
Single Plane A2C EF: 54.5 %
Single Plane A4C EF: 69.2 %
Weight: 3453.29 oz

## 2021-01-16 LAB — PROTIME-INR
INR: 1.1 (ref 0.8–1.2)
Prothrombin Time: 14.1 seconds (ref 11.4–15.2)

## 2021-01-16 LAB — CBC WITH DIFFERENTIAL/PLATELET
Abs Immature Granulocytes: 0.06 10*3/uL (ref 0.00–0.07)
Basophils Absolute: 0.1 10*3/uL (ref 0.0–0.1)
Basophils Relative: 1 %
Eosinophils Absolute: 0.8 10*3/uL — ABNORMAL HIGH (ref 0.0–0.5)
Eosinophils Relative: 12 %
HCT: 35.4 % — ABNORMAL LOW (ref 36.0–46.0)
Hemoglobin: 10.8 g/dL — ABNORMAL LOW (ref 12.0–15.0)
Immature Granulocytes: 1 %
Lymphocytes Relative: 20 %
Lymphs Abs: 1.4 10*3/uL (ref 0.7–4.0)
MCH: 28.4 pg (ref 26.0–34.0)
MCHC: 30.5 g/dL (ref 30.0–36.0)
MCV: 93.2 fL (ref 80.0–100.0)
Monocytes Absolute: 0.6 10*3/uL (ref 0.1–1.0)
Monocytes Relative: 9 %
Neutro Abs: 3.9 10*3/uL (ref 1.7–7.7)
Neutrophils Relative %: 57 %
Platelets: 184 10*3/uL (ref 150–400)
RBC: 3.8 MIL/uL — ABNORMAL LOW (ref 3.87–5.11)
RDW: 15.7 % — ABNORMAL HIGH (ref 11.5–15.5)
WBC: 6.8 10*3/uL (ref 4.0–10.5)
nRBC: 0 % (ref 0.0–0.2)

## 2021-01-16 LAB — COMPREHENSIVE METABOLIC PANEL
ALT: 15 U/L (ref 0–44)
AST: 22 U/L (ref 15–41)
Albumin: 3.1 g/dL — ABNORMAL LOW (ref 3.5–5.0)
Alkaline Phosphatase: 43 U/L (ref 38–126)
Anion gap: 8 (ref 5–15)
BUN: 8 mg/dL (ref 8–23)
CO2: 26 mmol/L (ref 22–32)
Calcium: 9.3 mg/dL (ref 8.9–10.3)
Chloride: 109 mmol/L (ref 98–111)
Creatinine, Ser: 1.08 mg/dL — ABNORMAL HIGH (ref 0.44–1.00)
GFR, Estimated: 52 mL/min — ABNORMAL LOW (ref >60)
Glucose, Bld: 86 mg/dL (ref 70–99)
Potassium: 4.1 mmol/L (ref 3.5–5.1)
Sodium: 143 mmol/L (ref 135–145)
Total Bilirubin: 1.2 mg/dL (ref 0.3–1.2)
Total Protein: 5.7 g/dL — ABNORMAL LOW (ref 6.5–8.1)

## 2021-01-16 LAB — APTT: aPTT: 32 seconds (ref 24–36)

## 2021-01-16 LAB — MAGNESIUM: Magnesium: 1.8 mg/dL (ref 1.7–2.4)

## 2021-01-16 MED ORDER — POLYETHYLENE GLYCOL 3350 17 G PO PACK: 17.0000 g | PACK | Freq: Every day | ORAL | Status: DC | PRN

## 2021-01-16 MED ORDER — ONDANSETRON HCL 4 MG PO TABS: 4.0000 mg | ORAL_TABLET | Freq: Four times a day (QID) | ORAL | Status: DC | PRN

## 2021-01-16 MED ORDER — ACETAMINOPHEN 325 MG PO TABS
650.0000 mg | ORAL_TABLET | Freq: Four times a day (QID) | ORAL | Status: DC | PRN
  Administered 2021-01-18: 650 mg via ORAL
  Filled 2021-01-16: qty 2

## 2021-01-16 MED ORDER — ONDANSETRON HCL 4 MG/2ML IJ SOLN: 4.0000 mg | Freq: Four times a day (QID) | INTRAMUSCULAR | Status: DC | PRN

## 2021-01-16 MED ORDER — ACETAMINOPHEN 650 MG RE SUPP: 650.0000 mg | Freq: Four times a day (QID) | RECTAL | Status: DC | PRN

## 2021-01-16 MED ORDER — HYDRALAZINE HCL 20 MG/ML IJ SOLN: 10.0000 mg | Freq: Four times a day (QID) | INTRAMUSCULAR | Status: DC | PRN

## 2021-01-16 MED ORDER — IRBESARTAN 150 MG PO TABS
150.0000 mg | ORAL_TABLET | Freq: Every day | ORAL | Status: DC
  Administered 2021-01-16 – 2021-01-19 (×4): 150 mg via ORAL
  Filled 2021-01-16 (×4): qty 1

## 2021-01-16 MED ORDER — ATORVASTATIN CALCIUM 20 MG PO TABS
20.0000 mg | ORAL_TABLET | Freq: Every day | ORAL | Status: DC
  Administered 2021-01-16 – 2021-01-19 (×4): 20 mg via ORAL
  Filled 2021-01-16 (×4): qty 1

## 2021-01-16 MED ORDER — ENOXAPARIN SODIUM 40 MG/0.4ML IJ SOSY
40.0000 mg | PREFILLED_SYRINGE | INTRAMUSCULAR | Status: DC
  Administered 2021-01-16 – 2021-01-19 (×4): 40 mg via SUBCUTANEOUS
  Filled 2021-01-16 (×3): qty 0.4

## 2021-01-16 MED ORDER — AMLODIPINE BESYLATE 5 MG PO TABS
10.0000 mg | ORAL_TABLET | Freq: Every day | ORAL | Status: DC
  Administered 2021-01-16: 10 mg via ORAL
  Filled 2021-01-16: qty 2

## 2021-01-16 NOTE — Assessment & Plan Note (Signed)
.   Continuing home regimen of lipid lowering therapy.  

## 2021-01-16 NOTE — Assessment & Plan Note (Signed)
   Extensive disease  Confirmed via biopsy by patient's dermatologist Dr. Margo Aye in 10/2020  On immunologic therapy as an outpatient with [facility] which was started 2 weeks ago   I doubt this new immunologic is related to patient's current presentation  One possibility is that patient is developing pulmonary hypertension as pulmonary hypertension is linked to cases of severe psoriasis.

## 2021-01-16 NOTE — Assessment & Plan Note (Addendum)
   Patient presenting with gradually worsening shortness of breath and dyspnea with exertion over the past several weeks to months  This is associated with intermittent bouts of hypoxia  No evidence of pneumonia on review of chest imaging  CT angiogram of the chest was performed due to elevated D-dimer and reveals no evidence of obvious pulmonary embolism  VBG reveals normal pH and no significant hypercapnia  Patient does have bilateral lower extremity pitting edema but has no other sequela of cardiogenic volume overload.  No PND, no pillow orthopnea, no elevation in JVP on exam.  No pulmonary rales on exam with clear chest x-ray.  BNP unremarkable   COVID-19 and influenza testing negative  Patient also reports having recently been placed on Otezla approximately 2 weeks ago for her extensive psoriasis however upon my review of the associated literature this type of presentation does not seem to be related to [place] use.  Will obtain echocardiogram to be certain that this is not cardiogenic or perhaps pulmonary hypertension which is of increased incidence in severe psoriasis.     Alternatively the severe hiatal hernia itself could be causing the patient's respiratory symptoms as severe cases of hiatal hernia can cause respiratory symptoms.  If this is to be pursued a surgical consultation would be needed.    Continue supplemental oxygen as needed for bouts of hypoxia

## 2021-01-16 NOTE — Progress Notes (Signed)
*  PRELIMINARY RESULTS* Echocardiogram 2D Echocardiogram has been performed.  Carolyne Fiscal 01/16/2021, 2:49 PM

## 2021-01-16 NOTE — Assessment & Plan Note (Addendum)
   While patient has a substantially elevated D-dimer CT angiogram of the chest was negative  CT angiogram was suboptimal and therefore it is possible there are small pulmonary emboli that were missed although overall clinical picture is not consistent with pulmonary embolism  Will obtain bilateral lower extremity ultrasound to rule out DVT due to complaints for intermittent lower extremity edema

## 2021-01-16 NOTE — Assessment & Plan Note (Signed)
.   Resume patients home regimen of oral antihypertensives . Titrate antihypertensive regimen as necessary to achieve adequate BP control . PRN intravenous antihypertensives for excessively elevated blood pressure   

## 2021-01-16 NOTE — Progress Notes (Signed)
PROGRESS NOTE    Kaylyne Axton  ZOX:[id number] DOB: [birth year] DOA: 01/15/2021 PCP: Craig Staggers, MD    Brief Narrative:  80 year old female with a history of psoriasis, GERD, large hiatal hernia, admitted to the hospital with progressive shortness of breath.  Found to be hypoxic on ambulation.  Having difficulty ambulating a few steps without becoming short of breath.  CT angiogram negative for pulmonary embolism, pneumonia or pulmonary edema.  She is noted to have large hiatal hernia including her stomach and part of transverse colon.  She is undergoing echocardiogram to rule out any underlying pulmonary hypertension or other sources of shortness of breath.  Will likely be discharged home with supplemental oxygen.  If echocardiogram unrevealing, I would think that her hiatal hernia is likely playing a role   Assessment & Plan:   Principal Problem:   Shortness of breath Active Problems:   Essential hypertension   Large hiatal hernia   Mixed hyperlipidemia   GERD (gastroesophageal reflux disease)   Elevated d-dimer   Psoriasis   Obesity, Class III, BMI 40-49.9 (morbid obesity) (HCC)   Dyspnea/hypoxia -Etiology is not entirely clear -Reports progressively worsening shortness of breath over the last few weeks, worse on the past few days -She is becoming short of breath after taking a few steps across the room -She has not had any wheezing or cough -CT angiogram of the chest was negative for pulmonary embolism.  There did not appear to be significant pulmonary parenchymal disease.  No signs of pneumonia or volume overload -BNP normal, cardiac enzymes normal -It did comment on significant hiatal hernia with her stomach and part of her transverse colon and her chest -Certainly this could be the cause of her shortness of breath -Echocardiogram has been ordered to ensure that she does not have significant pulmonary hypertension/right-sided heart failure -Does report having been  diagnosed with sleep apnea in the past and has been supposed to wear oxygen at night, but has not been doing so. -She may need to discharge home with supplemental oxygen  Large hiatal hernia -I have made a referral to [facility] so she can discuss operative management.  Appointment has been made for 01/29/2021  Psoriasis -Followed by dermatologist, Dr. Margo Aye -Reports was recently started on Otezla approximately 2 weeks ago -She discussed her shortness of breath with Dr. Margo Aye who recommended to stop [facility] [place] for now until etiology of shortness of breath is determined  Hypertension -She is on ARB as well as amlodipine -Blood pressures appear to be running on the lower end today -We will discontinue amlodipine since she also seems to have significant lower extremity edema  Lower extremity edema -Venous Dopplers negative for DVT -Suspect she may have an element of venous stasis -This may be related to amlodipine -We will provide TED hose -Follow-up echocardiogram -Advised to keep her legs elevated -Can give a trial of Lasix if blood pressure allows  Hyperlipidemia -Continue statin  GERD -Continue PPI  Elevated D-dimer -Imaging negative for DVT/PE -Question related to underlying psoriasis  Morbid obesity -BMI 42.1 -Counseled on importance of diet and exercise   DVT prophylaxis: Place TED hose Start: 01/16/21 1111 enoxaparin (LOVENOX) injection 40 mg Start: 01/16/21 0600  Code Status: Full code Family Communication: Discussed with patient Disposition Plan: Status is: Observation  The patient remains OBS appropriate and will d/c before 2 midnights.        Consultants:    Procedures:  Echocardiogram, report pending  Antimicrobials:      Subjective: Overall,  she feels that her breathing may be mildly better today as compared to yesterday.  She does not have any cough or wheezing.  No chest pain.  Objective: Vitals:   01/16/21 0502 01/16/21 1100  01/16/21 1200 01/16/21 1500  BP: (!) 159/65 (!) 58/24 (!) 94/58 (!) 102/56  Pulse: 81 (!) 56    Resp: 18     Temp: 98.2 F (36.8 C)     TempSrc: Oral     SpO2: 92%     Weight:      Height:        Intake/Output Summary (Last 24 hours) at 01/16/2021 1700 Last data filed at 01/16/2021 1300 Gross per 24 hour  Intake 480 ml  Output --  Net 480 ml   Filed Weights   01/16/21 0013  Weight: 97.9 kg    Examination:  General exam: Appears calm and comfortable  Respiratory system: Clear to auscultation. Respiratory effort normal. Cardiovascular system: S1 & S2 heard, RRR. No JVD, murmurs, rubs, gallops or clicks.  Gastrointestinal system: Abdomen is nondistended, soft and nontender. No organomegaly or masses felt. Normal bowel sounds heard. Central nervous system: Alert and oriented. No focal neurological deficits. Extremities: Bilateral lower extremity edema Skin: No rashes, lesions or ulcers Psychiatry: Judgement and insight appear normal. Mood & affect appropriate.     Data Reviewed: I have personally reviewed following labs and imaging studies  CBC: Recent Labs  Lab 01/15/21 1643 01/16/21 0528  WBC 8.9 6.8  NEUTROABS  --  3.9  HGB 12.1 10.8*  HCT 39.0 35.4*  MCV 92.4 93.2  PLT 224 184   Basic Metabolic Panel: Recent Labs  Lab 01/15/21 1643 01/16/21 0528  NA 142 143  K 4.1 4.1  CL 106 109  CO2 27 26  GLUCOSE 115* 86  BUN 10 8  CREATININE 1.32* 1.08*  CALCIUM 9.6 9.3  MG  --  1.8   GFR: Estimated Creatinine Clearance: 44.3 mL/min (A) (by C-G formula based on SCr of 1.08 mg/dL (H)). Liver Function Tests: Recent Labs  Lab 01/16/21 0528  AST 22  ALT 15  ALKPHOS 43  BILITOT 1.2  PROT 5.7*  ALBUMIN 3.1*   No results for input(s): LIPASE, AMYLASE in the last 168 hours. No results for input(s): AMMONIA in the last 168 hours. Coagulation Profile: Recent Labs  Lab 01/16/21 0528  INR 1.1   Cardiac Enzymes: No results for input(s): CKTOTAL, CKMB,  CKMBINDEX, TROPONINI in the last 168 hours. BNP (last 3 results) No results for input(s): PROBNP in the last 8760 hours. HbA1C: No results for input(s): HGBA1C in the last 72 hours. CBG: No results for input(s): GLUCAP in the last 168 hours. Lipid Profile: No results for input(s): CHOL, HDL, LDLCALC, TRIG, CHOLHDL, LDLDIRECT in the last 72 hours. Thyroid Function Tests: No results for input(s): TSH, T4TOTAL, FREET4, T3FREE, THYROIDAB in the last 72 hours. Anemia Panel: No results for input(s): VITAMINB12, FOLATE, FERRITIN, TIBC, IRON, RETICCTPCT in the last 72 hours. Sepsis Labs: No results for input(s): PROCALCITON, LATICACIDVEN in the last 168 hours.  Recent Results (from the past 240 hour(s))  Resp Panel by RT-PCR (Flu A&B, Covid) Nasopharyngeal Swab     Status: None   Collection Time: 01/15/21  8:45 PM   Specimen: Nasopharyngeal Swab; Nasopharyngeal(NP) swabs in vial transport medium  Result Value Ref Range Status   SARS Coronavirus 2 by RT PCR NEGATIVE NEGATIVE Final    Comment: (NOTE) SARS-CoV-2 target nucleic acids are NOT DETECTED.  The SARS-CoV-2 RNA  is generally detectable in upper respiratory specimens during the acute phase of infection. The lowest concentration of SARS-CoV-2 viral copies this assay can detect is 138 copies/mL. A negative result does not preclude SARS-Cov-2 infection and should not be used as the sole basis for treatment or other patient management decisions. A negative result may occur with  improper specimen collection/handling, submission of specimen other than nasopharyngeal swab, presence of viral mutation(s) within the areas targeted by this assay, and inadequate number of viral copies(<138 copies/mL). A negative result must be combined with clinical observations, patient history, and epidemiological information. The expected result is Negative.  Fact Sheet for Patients:  BloggerCourse.com  Fact Sheet for Healthcare  Providers:  SeriousBroker.it  This test is no t yet approved or cleared by the [place] FDA and  has been authorized for detection and/or diagnosis of SARS-CoV-2 by FDA under an Emergency Use Authorization (EUA). This EUA will remain  in effect (meaning this test can be used) for the duration of the COVID-19 declaration under Section 564(b)(1) of the Act, 21 U.S.C.section 360bbb-3(b)(1), unless the authorization is terminated  or revoked sooner.       Influenza A by PCR NEGATIVE NEGATIVE Final   Influenza B by PCR NEGATIVE NEGATIVE Final    Comment: (NOTE) The Xpert Xpress SARS-CoV-2/FLU/RSV plus assay is intended as an aid in the diagnosis of influenza from Nasopharyngeal swab specimens and should not be used as a sole basis for treatment. Nasal washings and aspirates are unacceptable for Xpert Xpress SARS-CoV-2/FLU/RSV testing.  Fact Sheet for Patients: BloggerCourse.com  Fact Sheet for Healthcare Providers: SeriousBroker.it  This test is not yet approved or cleared by the [place] FDA and has been authorized for detection and/or diagnosis of SARS-CoV-2 by FDA under an Emergency Use Authorization (EUA). This EUA will remain in effect (meaning this test can be used) for the duration of the COVID-19 declaration under Section 564(b)(1) of the Act, 21 U.S.C. section 360bbb-3(b)(1), unless the authorization is terminated or revoked.  Performed at [facility], [address], [place], [place] [address]          Radiology Studies: DG Chest 2 View  Result Date: 01/15/2021 CLINICAL DATA:  Shortness of breath. EXAM: CHEST - 2 VIEW COMPARISON:  None. FINDINGS: There is a moderate-sized left diaphragmatic hernia and large hiatal hernia. There is no definite lung consolidation, pleural effusion or pneumothorax identified. Cardiomediastinal silhouette is grossly within normal limits. No acute  fractures are seen. There is a right shoulder arthroplasty. IMPRESSION: 1. There is a moderate-sized left diaphragmatic hernia and large hiatal hernia. This may be contributing to patient's shortness of breath. 2. There is no definite focal lung infiltrate. Electronically Signed   By: Darliss Cheney M.D.   On: 01/15/2021 17:27   CT Angio Chest PE W and/or Wo Contrast  Result Date: 01/15/2021 CLINICAL DATA:  PE suspected positive D-dimer EXAM: CT ANGIOGRAPHY CHEST WITH CONTRAST TECHNIQUE: Multidetector CT imaging of the chest was performed using the standard protocol during bolus administration of intravenous contrast. Multiplanar CT image reconstructions and MIPs were obtained to evaluate the vascular anatomy. CONTRAST:  OMNIPAQUE IOHEXOL 350 MG/ML SOLN COMPARISON:  None. FINDINGS: Cardiovascular: No filling defects within the pulmonary arteries to suggest acute pulmonary embolism. Suboptimal exam due to body habitus. Mediastinum/Nodes: No axillary or supraclavicular adenopathy. No mediastinal or hilar adenopathy. No pericardial fluid. Esophagus normal. Large diaphragmatic hernia with the near entirety of the stomach and the transverse colon above the hemidiaphragms posterior the heart.  Lungs/Pleura: No pulmonary infarction. No pulmonary edema. Mild LEFT basilar atelectasis associated to the elevated hemidiaphragm. Upper Abdomen: Limited view of the liver, kidneys, pancreas are unremarkable. Normal adrenal glands. Large hiatal hernia as above. Musculoskeletal: No aggressive osseous lesion identified. Review of the MIP images confirms the above findings. IMPRESSION: 1. No acute pulmonary embolism. Suboptimal exam due to body habitus. 2. 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 Electronically Signed   By: Genevive Bi M.D.   On: 01/15/2021 21:44   US Venous Img Lower Bilateral (DVT)  Result Date:  01/16/2021 CLINICAL DATA:  Increased D-dimer EXAM: BILATERAL LOWER EXTREMITY VENOUS DOPPLER ULTRASOUND TECHNIQUE: Gray-scale sonography with graded compression, as well as color Doppler and duplex ultrasound were performed to evaluate the lower extremity deep venous systems from the level of the common femoral vein and including the common femoral, femoral, profunda femoral, popliteal and calf veins including the posterior tibial, peroneal and gastrocnemius veins when visible. The superficial great saphenous vein was also interrogated. Spectral Doppler was utilized to evaluate flow at rest and with distal augmentation maneuvers in the common femoral, femoral and popliteal veins. COMPARISON:  None. FINDINGS: RIGHT LOWER EXTREMITY Common Femoral Vein: No evidence of thrombus. Normal compressibility, respiratory phasicity and response to augmentation. Saphenofemoral Junction: No evidence of thrombus. Normal compressibility and flow on color Doppler imaging. Profunda Femoral Vein: No evidence of thrombus. Normal compressibility and flow on color Doppler imaging. Femoral Vein: No evidence of thrombus. Normal compressibility, respiratory phasicity and response to augmentation. Popliteal Vein: No evidence of thrombus. Normal compressibility, respiratory phasicity and response to augmentation. Calf Veins: No evidence of thrombus. Normal compressibility and flow on color Doppler imaging. Superficial Great Saphenous Vein: No evidence of thrombus. Normal compressibility. Venous Reflux:  None. Other Findings:  Superficial edema in the calf. LEFT LOWER EXTREMITY Common Femoral Vein: No evidence of thrombus. Normal compressibility, respiratory phasicity and response to augmentation. Saphenofemoral Junction: No evidence of thrombus. Normal compressibility and flow on color Doppler imaging. Profunda Femoral Vein: No evidence of thrombus. Normal compressibility and flow on color Doppler imaging. Femoral Vein: No evidence of  thrombus. Normal compressibility, respiratory phasicity and response to augmentation. Popliteal Vein: No evidence of thrombus. Normal compressibility, respiratory phasicity and response to augmentation. Calf Veins: No evidence of thrombus. Normal compressibility and flow on color Doppler imaging. Superficial Great Saphenous Vein: No evidence of thrombus. Normal compressibility. Venous Reflux:  None. Other Findings:  Superficial edema in the calf. IMPRESSION: No evidence of deep venous thrombosis in either lower extremity. Electronically Signed   By: Caprice Renshaw M.D.   On: 01/16/2021 10:04        Scheduled Meds:  atorvastatin  20 mg Oral Daily   enoxaparin (LOVENOX) injection  40 mg Subcutaneous Q24H   furosemide  40 mg Intravenous Once   irbesartan  150 mg Oral Daily   Continuous Infusions:   LOS: 0 days    Time spent:    Erick Blinks, MD [facility]   If 7PM-7AM, please contact night-coverage [email or web address]  01/16/2021, 5:00 PM

## 2021-01-16 NOTE — H&P (Signed)
History and Physical    Destiny Barajas ZOX:[id number] DOB: 1940-03-16 DOA: 01/15/2021  PCP: Craig Staggers, MD  Patient coming from: Home   Chief Complaint:  Chief Complaint  Patient presents with   Shortness of Breath     HPI:    80 year old female with past medical history of hypertension, large hiatal hernia, hyperlipidemia, diabetic polyneuropathy, gastroesophageal reflux disease who presents to [facility] emergency department with complaints of shortness of breath.  Patient explains that approximately 2 weeks ago she began to experience shortness of breath shortness of breath was initially mild in intensity but as the days progressed became more and more severe.  Shortness of breath is worse with exertion and improved with rest.  Patient complains of associated bilateral lower extremity edema.  Patient denies paroxysmal nocturnal dyspnea or pillow orthopnea.  Patient denies cough, chest pain, fevers, sick contacts, recent travel or contact with confirmed COVID-19 infection.    After symptoms persisted for approximately 1 week she was placed on Lasix therapy by her outpatient provider.  Patient states that this transiently helped her lower extremity edema in addition to causing her to lose 6 pounds presumably due to the diuresis but overall her shortness of breath continue to worsen.  Patient symptoms continue to worsen until she eventually presented to [facility] emergency department for evaluation.  Upon evaluation in the emergency department patient was found to be quite tachypneic throughout the emergency department stay with respiratory rates upwards of 30 times per minute.  A D-dimer was performed and found to be elevated at 3.18 which was followed up with CT angiogram of the chest.  CT angiogram of the chest revealed no evidence of pulmonary embolism although exam was somewhat suboptimal due to body habitus.  It did identify however a very large diaphragmatic  hernia.  Due to patient's ongoing shortness of breath the hospitalist group was then called to assess the patient for admission to the hospital.  Review of Systems:   Review of Systems  Respiratory:  Positive for shortness of breath.   Cardiovascular:  Positive for leg swelling.  Neurological:  Positive for weakness.  All other systems reviewed and are negative.  Past Medical History:  Diagnosis Date   Acid reflux    Arthritis    Headache    Hypercholesteremia    Hypertension    Migraines     Past Surgical History:  Procedure Laterality Date   TOTAL SHOULDER ARTHROPLASTY Right    TUBAL LIGATION       reports that she has never smoked. She has never used smokeless tobacco. She reports that she does not drink alcohol and does not use drugs.  Allergies  Allergen Reactions   Prochlorperazine Shortness Of Breath   Prochlorperazine Edisylate Shortness Of Breath and Anaphylaxis    Other reaction(s): Respiratory distress    Family History  Problem Relation Age of Onset   Congestive Heart Failure Mother    Breast cancer Mother    Heart attack Father    Brain cancer Brother    Stomach cancer Brother    High Cholesterol Brother    Hypertension Brother    Epilepsy Daughter    Cerebral palsy Daughter    Hypertension Daughter    Diabetes Daughter    Gout Son    Hypertension Son    High Cholesterol Son    High Cholesterol Son      Prior to Admission medications   Medication Sig Start Date End Date Taking? Authorizing Provider  amLODipine (NORVASC) 10 MG tablet Take 10 mg by mouth daily.   Yes [provider]  Apremilast (OTEZLA PO) Take 30 mg by mouth 2 (two) times daily.   Yes [provider]  atorvastatin (LIPITOR) 40 MG tablet Take 20 mg by mouth daily.   Yes [provider]  Biotin 32992 MCG TABS Take 1 tablet by mouth daily.   Yes [provider]  calcium carbonate (OS-CAL) 1250 (500 Ca) MG chewable tablet Chew 1 tablet by mouth  daily. Not sure of dose but takes daily OTC calcium   Yes [provider]  cholecalciferol (VITAMIN D3) 25 MCG (1000 UT) tablet Take 1,000 Units by mouth daily.   Yes [provider]  Fluocinolone Acetonide Scalp 0.01 % OIL Apply topically at bedtime. 12/11/20  Yes [provider]  fluticasone (CUTIVATE) 0.05 % cream Apply 1 application topically daily. 12/29/20  Yes [provider]  furosemide (LASIX) 20 MG tablet Take 20 mg by mouth daily. 12/26/20  Yes [provider]  valsartan (DIOVAN) 160 MG tablet Take 160 mg by mouth daily. 09/01/18  Yes [provider]  vitamin C (ASCORBIC ACID) 500 MG tablet Take 500 mg by mouth daily.   Yes [provider]  clobetasol cream (TEMOVATE) 0.05 % Apply topically. Patient not taking: Reported on 01/15/2021 12/18/20   [provider]  gabapentin (NEURONTIN) 100 MG capsule 2 CAPSULES TWICE A DAY Patient not taking: Reported on 02/26/2019 09/11/18   Drema Dallas, DO  hydrochlorothiazide (HYDRODIURIL) 12.5 MG tablet TAKE 1 TABLET BY MOUTH EVERY DAY IN THE MORNING Patient not taking: Reported on 01/15/2021 08/24/18   [provider]  ketoconazole (NIZORAL) 2 % shampoo Apply topically 2 (two) times a week. Patient not taking: Reported on 01/15/2021 11/06/20   [provider]  nortriptyline (PAMELOR) 10 MG capsule TAKE 1 CAPSULE BY MOUTH EVERYDAY AT BEDTIME Patient not taking: No sig reported 02/27/18   Drema Dallas, DO  triamcinolone ointment (KENALOG) 0.1 % Apply 1 application topically 2 (two) times daily. Patient not taking: Reported on 01/15/2021 11/29/20   [provider]    Physical Exam: Vitals:   01/15/21 2216 01/15/21 2230 01/16/21 0013 01/16/21 0502  BP: 100/79 (!) 123/111 (!) 111/98 (!) 159/65  Pulse: 89 84 72 81  Resp: (!) 24 (!) 22 20 18   Temp:   98.3 F (36.8 C) 98.2 F (36.8 C)  TempSrc:    Oral  SpO2: 98% 96% 95% 92%  Weight:   97.9 kg   Height:    5' (1.524 m)     Constitutional: Awake alert and oriented x3, no associated distress.   Skin: Extensive areas of scaly hyperemic rash over the extensor surfaces of the extremities, chest and neck with some associated warmth without induration.  Somewhat poor skin turgor noted. Eyes: Pupils are equally reactive to light.  No evidence of scleral icterus or conjunctival pallor.  ENMT: Moist mucous membranes noted.  Posterior pharynx clear of any exudate or lesions.   Neck: normal, supple, no masses, no thyromegaly.  No evidence of jugular venous distension.   Respiratory: clear to auscultation bilaterally, no wheezing, no crackles. Normal respiratory effort. No accessory muscle use.  Cardiovascular: Regular rate and rhythm, no murmurs / rubs / gallops. No extremity edema. 2+ pedal pulses. No carotid bruits.  Chest:   Nontender without crepitus or deformity.   Back:   Nontender without crepitus or deformity. Abdomen: Abdomen is protuberant but soft and nontender.  No evidence of intra-abdominal masses.  Positive bowel sounds noted in all quadrants.   Musculoskeletal: No joint deformity upper and lower extremities. Good ROM, no contractures. Normal muscle tone.  Neurologic: CN 2-12 grossly intact. Sensation intact.  Patient moving all 4 extremities spontaneously.  Patient is following all commands.  Patient is responsive to verbal stimuli.   Psychiatric: Patient exhibits normal mood with appropriate affect.  Patient seems to possess insight as to their current situation.     Labs on Admission: I have personally reviewed following labs and imaging studies -   CBC: Recent Labs  Lab 01/15/21 1643 01/16/21 0528  WBC 8.9 6.8  NEUTROABS  --  3.9  HGB 12.1 10.8*  HCT 39.0 35.4*  MCV 92.4 93.2  PLT 224 184   Basic Metabolic Panel: Recent Labs  Lab 01/15/21 1643 01/16/21 0528  NA 142 143  K 4.1 4.1  CL 106 109  CO2 27 26  GLUCOSE 115* 86  BUN 10 8  CREATININE 1.32* 1.08*  CALCIUM 9.6  9.3  MG  --  1.8   GFR: Estimated Creatinine Clearance: 44.3 mL/min (A) (by C-G formula based on SCr of 1.08 mg/dL (H)). Liver Function Tests: Recent Labs  Lab 01/16/21 0528  AST 22  ALT 15  ALKPHOS 43  BILITOT 1.2  PROT 5.7*  ALBUMIN 3.1*   No results for input(s): LIPASE, AMYLASE in the last 168 hours. No results for input(s): AMMONIA in the last 168 hours. Coagulation Profile: Recent Labs  Lab 01/16/21 0528  INR 1.1   Cardiac Enzymes: No results for input(s): CKTOTAL, CKMB, CKMBINDEX, TROPONINI in the last 168 hours. BNP (last 3 results) No results for input(s): PROBNP in the last 8760 hours. HbA1C: No results for input(s): HGBA1C in the last 72 hours. CBG: No results for input(s): GLUCAP in the last 168 hours. Lipid Profile: No results for input(s): CHOL, HDL, LDLCALC, TRIG, CHOLHDL, LDLDIRECT in the last 72 hours. Thyroid Function Tests: No results for input(s): TSH, T4TOTAL, FREET4, T3FREE, THYROIDAB in the last 72 hours. Anemia Panel: No results for input(s): VITAMINB12, FOLATE, FERRITIN, TIBC, IRON, RETICCTPCT in the last 72 hours. Urine analysis: No results found for: COLORURINE, APPEARANCEUR, LABSPEC, PHURINE, GLUCOSEU, HGBUR, BILIRUBINUR, KETONESUR, PROTEINUR, UROBILINOGEN, NITRITE, LEUKOCYTESUR  Radiological Exams on Admission - Personally Reviewed: DG Chest 2 View  Result Date: 01/15/2021 CLINICAL DATA:  Shortness of breath. EXAM: CHEST - 2 VIEW COMPARISON:  None. FINDINGS: There is a moderate-sized left diaphragmatic hernia and large hiatal hernia. There is no definite lung consolidation, pleural effusion or pneumothorax identified. Cardiomediastinal silhouette is grossly within normal limits. No acute fractures are seen. There is a right shoulder arthroplasty. IMPRESSION: 1. There is a moderate-sized left diaphragmatic hernia and large hiatal hernia. This may be contributing to patient's shortness of breath. 2. There is no definite focal lung infiltrate.  Electronically Signed   By: Darliss Cheney M.D.   On: 01/15/2021 17:27   CT Angio Chest PE W and/or Wo Contrast  Result Date: 01/15/2021 CLINICAL DATA:  PE suspected positive D-dimer EXAM: CT ANGIOGRAPHY CHEST WITH CONTRAST TECHNIQUE: Multidetector CT imaging of the chest was performed using the standard protocol during bolus administration of intravenous contrast. Multiplanar CT image reconstructions and MIPs were obtained to evaluate the vascular anatomy. CONTRAST:  OMNIPAQUE IOHEXOL 350 MG/ML SOLN COMPARISON:  None. FINDINGS: Cardiovascular: No filling defects within the pulmonary arteries to suggest acute pulmonary embolism. Suboptimal exam due to body habitus. Mediastinum/Nodes: No axillary or supraclavicular adenopathy. No  mediastinal or hilar adenopathy. No pericardial fluid. Esophagus normal. Large diaphragmatic hernia with the near entirety of the stomach and the transverse colon above the hemidiaphragms posterior the heart. Lungs/Pleura: No pulmonary infarction. No pulmonary edema. Mild LEFT basilar atelectasis associated to the elevated hemidiaphragm. Upper Abdomen: Limited view of the liver, kidneys, pancreas are unremarkable. Normal adrenal glands. Large hiatal hernia as above. Musculoskeletal: No aggressive osseous lesion identified. Review of the MIP images confirms the above findings. IMPRESSION: 1. No acute pulmonary embolism. Suboptimal exam due to body habitus. 2. 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 Electronically Signed   By: Genevive Bi M.D.   On: 01/15/2021 21:44    EKG: Personally reviewed.  Rhythm is normal sinus rhythm with heart rate of 80 bpm.  No dynamic ST segment changes appreciated.  Assessment/Plan  * Shortness of breath Patient presenting with gradually worsening shortness of breath and dyspnea with exertion over the past several weeks to months This is associated with  intermittent bouts of hypoxia No evidence of pneumonia on review of chest imaging CT angiogram of the chest was performed due to elevated D-dimer and reveals no evidence of obvious pulmonary embolism VBG reveals normal pH and no significant hypercapnia Patient does have bilateral lower extremity pitting edema but has no other sequela of cardiogenic volume overload.  No PND, no pillow orthopnea, no elevation in JVP on exam.  No pulmonary rales on exam with clear chest x-ray.  BNP unremarkable  COVID-19 and influenza testing negative Patient also reports having recently been placed on Otezla approximately 2 weeks ago for her extensive psoriasis however upon my review of the associated literature this type of presentation does not seem to be related to [place] use. Will obtain echocardiogram to be certain that this is not cardiogenic or perhaps pulmonary hypertension which is of increased incidence in severe psoriasis.    Alternatively the severe hiatal hernia itself could be causing the patient's respiratory symptoms as severe cases of hiatal hernia can cause respiratory symptoms.  If this is to be pursued a surgical consultation would be needed.   Continue supplemental oxygen as needed for bouts of hypoxia  Large hiatal hernia Extensive hiatal hernia noted on CT imaging of the chest Treating patient with proton pump inhibitor for associated gastroesophageal reflux disease Remainder of assessment and plan as above  GERD (gastroesophageal reflux disease) Likely related to substantial hiatal hernia Daily PPI   Psoriasis Extensive disease Confirmed via biopsy by patient's dermatologist Dr. Margo Aye in 10/2020 On immunologic therapy as an outpatient with [facility] which was started 2 weeks ago  I doubt this new immunologic is related to patient's current presentation One possibility is that patient is developing pulmonary hypertension as pulmonary hypertension is linked to cases of severe psoriasis.    Essential hypertension Resume patients home regimen of oral antihypertensives Titrate antihypertensive regimen as necessary to achieve adequate BP control PRN intravenous antihypertensives for excessively elevated blood pressure    Mixed hyperlipidemia Continuing home regimen of lipid lowering therapy.   Elevated d-dimer While patient has a substantially elevated D-dimer CT angiogram of the chest was negative CT angiogram was suboptimal and therefore it is possible there are small pulmonary emboli that were missed although overall clinical picture is not consistent with pulmonary embolism Will obtain bilateral lower extremity ultrasound to rule out DVT due to complaints for intermittent lower extremity edema      Code Status:  Full  code  code status decision has been confirmed with: patient Family Communication: deferred   Status is: Observation  The patient remains OBS appropriate and will d/c before 2 midnights.       Marinda Elk MD [facility] Pager [phone]  If 7PM-7AM, please contact night-coverage [email or web address] Use universal [place] password for that web site. If you do not have the password, please call the hospital operator.  01/16/2021, 7:23 AM

## 2021-01-16 NOTE — Assessment & Plan Note (Signed)
   Extensive hiatal hernia noted on CT imaging of the chest  Treating patient with proton pump inhibitor for associated gastroesophageal reflux disease  Remainder of assessment and plan as above

## 2021-01-16 NOTE — Assessment & Plan Note (Signed)
   Likely related to substantial hiatal hernia  Daily PPI

## 2021-01-17 DIAGNOSIS — E782 Mixed hyperlipidemia: Secondary | ICD-10-CM | POA: Diagnosis present

## 2021-01-17 DIAGNOSIS — R0600 Dyspnea, unspecified: Secondary | ICD-10-CM | POA: Diagnosis not present

## 2021-01-17 DIAGNOSIS — G4733 Obstructive sleep apnea (adult) (pediatric): Secondary | ICD-10-CM | POA: Diagnosis present

## 2021-01-17 DIAGNOSIS — I5031 Acute diastolic (congestive) heart failure: Secondary | ICD-10-CM

## 2021-01-17 DIAGNOSIS — Z87892 Personal history of anaphylaxis: Secondary | ICD-10-CM | POA: Diagnosis not present

## 2021-01-17 DIAGNOSIS — I10 Essential (primary) hypertension: Secondary | ICD-10-CM | POA: Diagnosis not present

## 2021-01-17 DIAGNOSIS — Z8249 Family history of ischemic heart disease and other diseases of the circulatory system: Secondary | ICD-10-CM | POA: Diagnosis not present

## 2021-01-17 DIAGNOSIS — R7989 Other specified abnormal findings of blood chemistry: Secondary | ICD-10-CM | POA: Diagnosis present

## 2021-01-17 DIAGNOSIS — I110 Hypertensive heart disease with heart failure: Secondary | ICD-10-CM | POA: Diagnosis present

## 2021-01-17 DIAGNOSIS — Z6841 Body Mass Index (BMI) 40.0 and over, adult: Secondary | ICD-10-CM | POA: Diagnosis not present

## 2021-01-17 DIAGNOSIS — M1990 Unspecified osteoarthritis, unspecified site: Secondary | ICD-10-CM | POA: Diagnosis present

## 2021-01-17 DIAGNOSIS — L409 Psoriasis, unspecified: Secondary | ICD-10-CM | POA: Diagnosis present

## 2021-01-17 DIAGNOSIS — E1142 Type 2 diabetes mellitus with diabetic polyneuropathy: Secondary | ICD-10-CM | POA: Diagnosis present

## 2021-01-17 DIAGNOSIS — K219 Gastro-esophageal reflux disease without esophagitis: Secondary | ICD-10-CM | POA: Diagnosis present

## 2021-01-17 DIAGNOSIS — Z83438 Family history of other disorder of lipoprotein metabolism and other lipidemia: Secondary | ICD-10-CM | POA: Diagnosis not present

## 2021-01-17 DIAGNOSIS — Z9851 Tubal ligation status: Secondary | ICD-10-CM | POA: Diagnosis not present

## 2021-01-17 DIAGNOSIS — Z20822 Contact with and (suspected) exposure to covid-19: Secondary | ICD-10-CM | POA: Diagnosis present

## 2021-01-17 DIAGNOSIS — Z888 Allergy status to other drugs, medicaments and biological substances status: Secondary | ICD-10-CM | POA: Diagnosis not present

## 2021-01-17 DIAGNOSIS — Z79899 Other long term (current) drug therapy: Secondary | ICD-10-CM | POA: Diagnosis not present

## 2021-01-17 DIAGNOSIS — Z8673 Personal history of transient ischemic attack (TIA), and cerebral infarction without residual deficits: Secondary | ICD-10-CM | POA: Diagnosis not present

## 2021-01-17 DIAGNOSIS — Z833 Family history of diabetes mellitus: Secondary | ICD-10-CM | POA: Diagnosis not present

## 2021-01-17 DIAGNOSIS — K449 Diaphragmatic hernia without obstruction or gangrene: Secondary | ICD-10-CM | POA: Diagnosis present

## 2021-01-17 DIAGNOSIS — I5033 Acute on chronic diastolic (congestive) heart failure: Secondary | ICD-10-CM | POA: Diagnosis present

## 2021-01-17 MED ORDER — FUROSEMIDE 10 MG/ML IJ SOLN
40.0000 mg | Freq: Every day | INTRAMUSCULAR | Status: DC
  Administered 2021-01-17 – 2021-01-18 (×2): 40 mg via INTRAVENOUS
  Filled 2021-01-17 (×2): qty 4

## 2021-01-17 NOTE — Plan of Care (Signed)
  Problem: Education: Goal: Knowledge of General Education information will improve Description: Including pain rating scale, medication(s)/side effects and non-pharmacologic comfort measures Outcome: Progressing   Problem: Clinical Measurements: Goal: Ability to maintain clinical measurements within normal limits will improve Outcome: Progressing   Problem: Activity: Goal: Risk for activity intolerance will decrease Outcome: Progressing   Problem: Nutrition: Goal: Adequate nutrition will be maintained Outcome: Progressing   Problem: Coping: Goal: Level of anxiety will decrease Outcome: Progressing   Problem: Elimination: Goal: Will not experience complications related to bowel motility Outcome: Progressing Goal: Will not experience complications related to urinary retention Outcome: Progressing   Problem: Pain Managment: Goal: General experience of comfort will improve Outcome: Progressing   

## 2021-01-17 NOTE — Care Management Obs Status (Signed)
MEDICARE OBSERVATION STATUS NOTIFICATION   Patient Details  Name: Filippa Yarbough MRN: [id number] Date of Birth: 1940-09-09   Medicare Observation Status Notification Given:  Yes    Corey Harold 01/17/2021, 10:37 AM

## 2021-01-17 NOTE — Progress Notes (Signed)
PROGRESS NOTE  Destiny Barajas XBJ:[id number] DOB: 1940/12/25 DOA: 01/15/2021 PCP: Craig Staggers, MD  Brief History:  80 year old female with a history of hypertension, hyperlipidemia, diabetes mellitus type 2, GERD presenting with 2-week history of shortness of breath that was worse over the last 4 days prior to admission.  The patient states that she was placed on furosemide 20 mg daily by her PCP which she took about 2 weeks.  She stated that it did not really help her shortness of breath or edema.  In addition, the patient has been complaining of lower extremity edema and increasing abdominal girth for the past 3 to 4 weeks.  She denies any frank orthopnea.  She states that she has OSA, but is unable to use a CPAP machine.  She states that she has gained about 7 pounds over the past month.  She denies any fevers, chills, headache, neck pain, chest pain, nausea, vomiting, diarrhea.  In the emergency department, the patient was noted to be tachypneic and hypoxic with ambulation.  CTA chest was performed and was negative for pulmonary embolus but showed a large diaphragmatic hernia.  The patient was admitted for further diagnostic work-up and treatment of her dyspnea.  Assessment/Plan: Acute Diastolic CHF -Felt that this is primarily right heart mediated -Start IV furosemide 40 mg daily -11/29 Echo--EF 60 to 65%, no WMA, grade 1 DD, PASP 39.7, trivial MR/TR -Daily weights -She appears clinically fluid overloaded with JVD and peripheral edema  Dyspnea -This is likely multifactorial including patient's fluid overload, large hiatus hernia, and deconditioning -CTA chest neg for PE -11/28 VBG 7.400/45/<35 on RA  Large hiatal hernia -I have made a referral to [facility] so she can discuss operative management.  Appointment has been made for 01/29/2021   Psoriasis -Followed by dermatologist, Dr. Margo Aye -Reports was recently started on Otezla approximately 2 weeks  ago -She discussed her shortness of breath with Dr. Margo Aye who recommended to stop [place] for now until etiology of shortness of breath is determined   Hypertension -She was on ARB as well as amlodipine -Blood pressures appear to be running on the lower end today -discontinue amlodipine   Lower extremity edema -Venous Dopplers negative for DVT -partly related to amlodipine -We will provide TED hose -Echo as above -Advised to keep her legs elevated   Hyperlipidemia -Continue statin   GERD -Continue PPI   Elevated D-dimer -Imaging negative for DVT/PE -ultrasound legs neg   Morbid obesity -BMI 42.1 -Counseled on importance of diet and exercise         Status is: Observation  The patient will require care spanning > 2 midnights and should be moved to inpatient because: due to severity of illness requiring IV lasix       Family Communication:   no Family at bedside  Consultants:  none  Code Status:  FULL  DVT Prophylaxis:  [place] Lovenox   Procedures: As Listed in Progress Note Above  Antibiotics: None        Subjective: Continues to complain of dyspnea on exertion.  Denies f/c ,cp, n/v/d, abd pain.  Objective: Vitals:   01/16/21 1500 01/16/21 2300 01/17/21 0500 01/17/21 1300  BP: (!) 102/56 (!) 140/58 (!) 142/56 (!) 141/59  Pulse:  81 74 82  Resp:    19  Temp:  98.9 F (37.2 C) 99 F (37.2 C) 97.9 F (36.6 C)  TempSrc:  Oral Oral Oral  SpO2:  94% 92%  97%  Weight:      Height:        Intake/Output Summary (Last 24 hours) at 01/17/2021 1346 Last data filed at 01/17/2021 0900 Gross per 24 hour  Intake 500 ml  Output 3 ml  Net 497 ml   Weight change:  Exam:  General:  Pt is alert, follows commands appropriately, not in acute distress HEENT: No icterus, No thrush, No neck mass, [place]/AT Cardiovascular: RRR, S1/S2, no rubs, no gallops +JVD Respiratory: fine bibasilar crackles. No wheeze Abdomen: Soft/+BS, non tender, non distended, no  guarding Extremities: 1 +LE edema, No lymphangitis, No petechiae, No rashes, no synovitis   Data Reviewed: I have personally reviewed following labs and imaging studies Basic Metabolic Panel: Recent Labs  Lab 01/15/21 1643 01/16/21 0528  NA 142 143  K 4.1 4.1  CL 106 109  CO2 27 26  GLUCOSE 115* 86  BUN 10 8  CREATININE 1.32* 1.08*  CALCIUM 9.6 9.3  MG  --  1.8   Liver Function Tests: Recent Labs  Lab 01/16/21 0528  AST 22  ALT 15  ALKPHOS 43  BILITOT 1.2  PROT 5.7*  ALBUMIN 3.1*   No results for input(s): LIPASE, AMYLASE in the last 168 hours. No results for input(s): AMMONIA in the last 168 hours. Coagulation Profile: Recent Labs  Lab 01/16/21 0528  INR 1.1   CBC: Recent Labs  Lab 01/15/21 1643 01/16/21 0528  WBC 8.9 6.8  NEUTROABS  --  3.9  HGB 12.1 10.8*  HCT 39.0 35.4*  MCV 92.4 93.2  PLT 224 184   Cardiac Enzymes: No results for input(s): CKTOTAL, CKMB, CKMBINDEX, TROPONINI in the last 168 hours. BNP: Invalid input(s): POCBNP CBG: No results for input(s): GLUCAP in the last 168 hours. HbA1C: No results for input(s): HGBA1C in the last 72 hours. Urine analysis: No results found for: COLORURINE, APPEARANCEUR, LABSPEC, PHURINE, GLUCOSEU, HGBUR, BILIRUBINUR, KETONESUR, PROTEINUR, UROBILINOGEN, NITRITE, LEUKOCYTESUR Sepsis Labs: @LABRCNTIP (procalcitonin:4,lacticidven:4) ) Recent Results (from the past 240 hour(s))  Resp Panel by RT-PCR (Flu A&B, Covid) Nasopharyngeal Swab     Status: None   Collection Time: 01/15/21  8:45 PM   Specimen: Nasopharyngeal Swab; Nasopharyngeal(NP) swabs in vial transport medium  Result Value Ref Range Status   SARS Coronavirus 2 by RT PCR NEGATIVE NEGATIVE Final    Comment: (NOTE) SARS-CoV-2 target nucleic acids are NOT DETECTED.  The SARS-CoV-2 RNA is generally detectable in upper respiratory specimens during the acute phase of infection. The lowest concentration of SARS-CoV-2 viral copies this assay can detect  is 138 copies/mL. A negative result does not preclude SARS-Cov-2 infection and should not be used as the sole basis for treatment or other patient management decisions. A negative result may occur with  improper specimen collection/handling, submission of specimen other than nasopharyngeal swab, presence of viral mutation(s) within the areas targeted by this assay, and inadequate number of viral copies(<138 copies/mL). A negative result must be combined with clinical observations, patient history, and epidemiological information. The expected result is Negative.  Fact Sheet for Patients:  01/17/21  Fact Sheet for Healthcare Providers:  BloggerCourse.com  This test is no t yet approved or cleared by the [place] FDA and  has been authorized for detection and/or diagnosis of SARS-CoV-2 by FDA under an Emergency Use Authorization (EUA). This EUA will remain  in effect (meaning this test can be used) for the duration of the COVID-19 declaration under Section 564(b)(1) of the Act, 21 U.S.C.section 360bbb-3(b)(1), unless the authorization is terminated  or revoked sooner.       Influenza A by PCR NEGATIVE NEGATIVE Final   Influenza B by PCR NEGATIVE NEGATIVE Final    Comment: (NOTE) The Xpert Xpress SARS-CoV-2/FLU/RSV plus assay is intended as an aid in the diagnosis of influenza from Nasopharyngeal swab specimens and should not be used as a sole basis for treatment. Nasal washings and aspirates are unacceptable for Xpert Xpress SARS-CoV-2/FLU/RSV testing.  Fact Sheet for Patients: BloggerCourse.com  Fact Sheet for Healthcare Providers: SeriousBroker.it  This test is not yet approved or cleared by the [place] FDA and has been authorized for detection and/or diagnosis of SARS-CoV-2 by FDA under an Emergency Use Authorization (EUA). This EUA will remain in effect  (meaning this test can be used) for the duration of the COVID-19 declaration under Section 564(b)(1) of the Act, 21 U.S.C. section 360bbb-3(b)(1), unless the authorization is terminated or revoked.  Performed at [facility], [address] [place], [place] [address]      Scheduled Meds:  atorvastatin  20 mg Oral Daily   enoxaparin (LOVENOX) injection  40 mg Subcutaneous Q24H   furosemide  40 mg Intravenous Daily   irbesartan  150 mg Oral Daily   Continuous Infusions:  Procedures/Studies: DG Chest 2 View  Result Date: 01/15/2021 CLINICAL DATA:  Shortness of breath. EXAM: CHEST - 2 VIEW COMPARISON:  None. FINDINGS: There is a moderate-sized left diaphragmatic hernia and large hiatal hernia. There is no definite lung consolidation, pleural effusion or pneumothorax identified. Cardiomediastinal silhouette is grossly within normal limits. No acute fractures are seen. There is a right shoulder arthroplasty. IMPRESSION: 1. There is a moderate-sized left diaphragmatic hernia and large hiatal hernia. This may be contributing to patient's shortness of breath. 2. There is no definite focal lung infiltrate. Electronically Signed   By: Darliss Cheney M.D.   On: 01/15/2021 17:27   CT Angio Chest PE W and/or Wo Contrast  Result Date: 01/15/2021 CLINICAL DATA:  PE suspected positive D-dimer EXAM: CT ANGIOGRAPHY CHEST WITH CONTRAST TECHNIQUE: Multidetector CT imaging of the chest was performed using the standard protocol during bolus administration of intravenous contrast. Multiplanar CT image reconstructions and MIPs were obtained to evaluate the vascular anatomy. CONTRAST:  OMNIPAQUE IOHEXOL 350 MG/ML SOLN COMPARISON:  None. FINDINGS: Cardiovascular: No filling defects within the pulmonary arteries to suggest acute pulmonary embolism. Suboptimal exam due to body habitus. Mediastinum/Nodes: No axillary or supraclavicular adenopathy. No mediastinal or hilar adenopathy. No pericardial fluid. Esophagus  normal. Large diaphragmatic hernia with the near entirety of the stomach and the transverse colon above the hemidiaphragms posterior the heart. Lungs/Pleura: No pulmonary infarction. No pulmonary edema. Mild LEFT basilar atelectasis associated to the elevated hemidiaphragm. Upper Abdomen: Limited view of the liver, kidneys, pancreas are unremarkable. Normal adrenal glands. Large hiatal hernia as above. Musculoskeletal: No aggressive osseous lesion identified. Review of the MIP images confirms the above findings. IMPRESSION: 1. No acute pulmonary embolism. Suboptimal exam due to body habitus. 2. 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 Electronically Signed   By: Genevive Bi M.D.   On: 01/15/2021 21:44   US Venous Img Lower Bilateral (DVT)  Result Date: 01/16/2021 CLINICAL DATA:  Increased D-dimer EXAM: BILATERAL LOWER EXTREMITY VENOUS DOPPLER ULTRASOUND TECHNIQUE: Gray-scale sonography with graded compression, as well as color Doppler and duplex ultrasound were performed to evaluate the lower extremity deep venous systems from the level of the common femoral vein and  including the common femoral, femoral, profunda femoral, popliteal and calf veins including the posterior tibial, peroneal and gastrocnemius veins when visible. The superficial great saphenous vein was also interrogated. Spectral Doppler was utilized to evaluate flow at rest and with distal augmentation maneuvers in the common femoral, femoral and popliteal veins. COMPARISON:  None. FINDINGS: RIGHT LOWER EXTREMITY Common Femoral Vein: No evidence of thrombus. Normal compressibility, respiratory phasicity and response to augmentation. Saphenofemoral Junction: No evidence of thrombus. Normal compressibility and flow on color Doppler imaging. Profunda Femoral Vein: No evidence of thrombus. Normal compressibility and flow on color Doppler imaging. Femoral Vein:  No evidence of thrombus. Normal compressibility, respiratory phasicity and response to augmentation. Popliteal Vein: No evidence of thrombus. Normal compressibility, respiratory phasicity and response to augmentation. Calf Veins: No evidence of thrombus. Normal compressibility and flow on color Doppler imaging. Superficial Great Saphenous Vein: No evidence of thrombus. Normal compressibility. Venous Reflux:  None. Other Findings:  Superficial edema in the calf. LEFT LOWER EXTREMITY Common Femoral Vein: No evidence of thrombus. Normal compressibility, respiratory phasicity and response to augmentation. Saphenofemoral Junction: No evidence of thrombus. Normal compressibility and flow on color Doppler imaging. Profunda Femoral Vein: No evidence of thrombus. Normal compressibility and flow on color Doppler imaging. Femoral Vein: No evidence of thrombus. Normal compressibility, respiratory phasicity and response to augmentation. Popliteal Vein: No evidence of thrombus. Normal compressibility, respiratory phasicity and response to augmentation. Calf Veins: No evidence of thrombus. Normal compressibility and flow on color Doppler imaging. Superficial Great Saphenous Vein: No evidence of thrombus. Normal compressibility. Venous Reflux:  None. Other Findings:  Superficial edema in the calf. IMPRESSION: No evidence of deep venous thrombosis in either lower extremity. Electronically Signed   By: Caprice Renshaw M.D.   On: 01/16/2021 10:04   ECHOCARDIOGRAM COMPLETE  Result Date: 01/16/2021    ECHOCARDIOGRAM REPORT   Patient Name:   BOSTYN KUNKLER Date of Exam: 01/16/2021 Medical Rec #:  [id number]     Height:       60.0 in Accession #:    [id number]    Weight:       215.8 lb Date of Birth:  09-06-1940     BSA:          1.928 m Patient Age:    80 years      BP:           94/58 mmHg Patient Gender: F             HR:           56 bpm. [facility] Procedure: 2D Echo, Cardiac Doppler and Color Doppler Indications:     Dyspnea  History:        Patient has no prior history of Echocardiogram examinations.                 TIA, Signs/Symptoms:Shortness of Breath; Risk                 Factors:Hypertension and Dyslipidemia.  Sonographer:    Mikki Harbor Referring Phys: [phone] JEHANZEB MEMON IMPRESSIONS  1. Left ventricular ejection fraction, by estimation, is 60 to 65%. The left ventricle has normal function. The left ventricle has no regional wall motion abnormalities. There is mild asymmetric left ventricular hypertrophy of the basal-septal segment. Left ventricular diastolic parameters are consistent with Grade I diastolic dysfunction (impaired relaxation).  2. Right ventricular systolic function is normal. The right ventricular size is normal. There is mildly elevated pulmonary artery systolic pressure.  The estimated right ventricular systolic pressure is 39.7 mmHg.  3. The mitral valve is degenerative. Trivial mitral valve regurgitation.  4. The aortic valve is tricuspid. There is mild calcification of the aortic valve. There is mild thickening of the aortic valve. Aortic valve regurgitation is not visualized. Aortic valve sclerosis is present, with no evidence of aortic valve stenosis.  5. The inferior vena cava is normal in size with greater than 50% respiratory variability, suggesting right atrial pressure of 3 mmHg. Comparison(s): No prior Echocardiogram. FINDINGS  Left Ventricle: Left ventricular ejection fraction, by estimation, is 60 to 65%. The left ventricle has normal function. The left ventricle has no regional wall motion abnormalities. The left ventricular internal cavity size was normal in size. There is  mild asymmetric left ventricular hypertrophy of the basal-septal segment. Left ventricular diastolic parameters are consistent with Grade I diastolic dysfunction (impaired relaxation). Right Ventricle: The right ventricular size is normal. No increase in right ventricular wall thickness. Right ventricular systolic  function is normal. There is mildly elevated pulmonary artery systolic pressure. The tricuspid regurgitant velocity is 3.03  m/s, and with an assumed right atrial pressure of 3 mmHg, the estimated right ventricular systolic pressure is 39.7 mmHg. Left Atrium: Left atrial size was normal in size. Right Atrium: Right atrial size was normal in size. Pericardium: There is no evidence of pericardial effusion. Mitral Valve: The mitral valve is degenerative in appearance. There is mild thickening of the mitral valve leaflet(s). There is mild calcification of the mitral valve leaflet(s). Mild to moderate mitral annular calcification. Trivial mitral valve regurgitation. MV peak gradient, 5.7 mmHg. The mean mitral valve gradient is 2.0 mmHg. Tricuspid Valve: The tricuspid valve is normal in structure. Tricuspid valve regurgitation is trivial. Aortic Valve: The aortic valve is tricuspid. There is mild calcification of the aortic valve. There is mild thickening of the aortic valve. Aortic valve regurgitation is not visualized. Aortic valve sclerosis is present, with no evidence of aortic valve stenosis. Aortic valve mean gradient measures 7.0 mmHg. Aortic valve peak gradient measures 16.3 mmHg. Aortic valve area, by VTI measures 2.61 cm. Pulmonic Valve: The pulmonic valve was normal in structure. Pulmonic valve regurgitation is mild. Aorta: The aortic root is normal in size and structure. Venous: The inferior vena cava is normal in size with greater than 50% respiratory variability, suggesting right atrial pressure of 3 mmHg. IAS/Shunts: No atrial level shunt detected by color flow Doppler.  LEFT VENTRICLE PLAX 2D LVIDd:         4.90 cm     Diastology LVIDs:         3.10 cm     LV e' medial:    6.12 cm/s LV PW:         0.90 cm     LV E/e' medial:  15.5 LV IVS:        1.20 cm     LV e' lateral:   8.12 cm/s LVOT diam:     1.90 cm     LV E/e' lateral: 11.7 LV SV:         97 LV SV Index:   50 LVOT Area:     2.84 cm  LV Volumes  (MOD) LV vol d, MOD A2C: 65.3 ml LV vol d, MOD A4C: 67.5 ml LV vol s, MOD A2C: 29.7 ml LV vol s, MOD A4C: 20.8 ml LV SV MOD A2C:     35.6 ml LV SV MOD A4C:     67.5 ml LV  SV MOD BP:      43.5 ml RIGHT VENTRICLE RV Basal diam:  3.20 cm RV Mid diam:    2.10 cm RV S prime:     16.50 cm/s TAPSE (M-mode): 2.8 cm LEFT ATRIUM             Index        RIGHT ATRIUM           Index LA diam:        3.80 cm 1.97 cm/m   RA Area:     13.20 cm LA Vol (A2C):   54.7 ml 28.37 ml/m  RA Volume:   30.30 ml  15.72 ml/m LA Vol (A4C):   62.9 ml 32.62 ml/m LA Biplane Vol: 58.1 ml 30.13 ml/m  AORTIC VALVE                     PULMONIC VALVE AV Area (Vmax):    2.12 cm      PV Vmax:       1.04 m/s AV Area (Vmean):   2.14 cm      PV Peak grad:  4.3 mmHg AV Area (VTI):     2.61 cm AV Vmax:           202.00 cm/s AV Vmean:          124.000 cm/s AV VTI:            0.370 m AV Peak Grad:      16.3 mmHg AV Mean Grad:      7.0 mmHg LVOT Vmax:         151.00 cm/s LVOT Vmean:        93.500 cm/s LVOT VTI:          0.341 m LVOT/AV VTI ratio: 0.92  AORTA Ao Root diam: 3.00 cm MITRAL VALVE               TRICUSPID VALVE MV Area (PHT): 2.81 cm    TR Peak grad:   36.7 mmHg MV Area VTI:   2.40 cm    TR Vmax:        303.00 cm/s MV Peak grad:  5.7 mmHg MV Mean grad:  2.0 mmHg    SHUNTS MV Vmax:       1.19 m/s    Systemic VTI:  0.34 m MV Vmean:      71.9 cm/s   Systemic Diam: 1.90 cm MV Decel Time: 270 msec MV E velocity: 94.60 cm/s MV A velocity: 93.60 cm/s MV E/A ratio:  1.01 Laurance Flatten MD Electronically signed by Laurance Flatten MD Signature Date/Time: 01/16/2021/5:39:40 PM    Final     Catarina Hartshorn, DO  [facility]  If 7PM-7AM, please contact night-coverage [email or web address] Password [email or web address] 01/17/2021, 1:46 PM   LOS: 0 days

## 2021-01-17 NOTE — Plan of Care (Signed)

## 2021-01-18 LAB — BASIC METABOLIC PANEL
Anion gap: 7 (ref 5–15)
BUN: 10 mg/dL (ref 8–23)
CO2: 26 mmol/L (ref 22–32)
Calcium: 8.4 mg/dL — ABNORMAL LOW (ref 8.9–10.3)
Chloride: 108 mmol/L (ref 98–111)
Creatinine, Ser: 1.29 mg/dL — ABNORMAL HIGH (ref 0.44–1.00)
GFR, Estimated: 42 mL/min — ABNORMAL LOW (ref >60)
Glucose, Bld: 95 mg/dL (ref 70–99)
Potassium: 3.7 mmol/L (ref 3.5–5.1)
Sodium: 141 mmol/L (ref 135–145)

## 2021-01-18 LAB — MAGNESIUM: Magnesium: 1.9 mg/dL (ref 1.7–2.4)

## 2021-01-18 NOTE — Progress Notes (Signed)
PROGRESS NOTE  Vendetta Pittinger VQM:[id number] DOB: 12/05/1940 DOA: 01/15/2021 PCP: Craig Staggers, MD  Brief History:  80 year old female with a history of hypertension, hyperlipidemia, diabetes mellitus type 2, GERD presenting with 2-week history of shortness of breath that was worse over the last 4 days prior to admission.  The patient states that she was placed on furosemide 20 mg daily by her PCP which she took about 2 weeks.  She stated that it did not really help her shortness of breath or edema.  In addition, the patient has been complaining of lower extremity edema and increasing abdominal girth for the past 3 to 4 weeks.  She denies any frank orthopnea.  She states that she has OSA, but is unable to use a CPAP machine.  She states that she has gained about 7 pounds over the past month.  She denies any fevers, chills, headache, neck pain, chest pain, nausea, vomiting, diarrhea.  In the emergency department, the patient was noted to be tachypneic and hypoxic with ambulation.  CTA chest was performed and was negative for pulmonary embolus but showed a large diaphragmatic hernia.  The patient was admitted for further diagnostic work-up and treatment of her dyspnea.   Assessment/Plan: Acute Diastolic CHF -Felt that this is primarily right heart mediated -Continue IV furosemide 40 mg daily -11/29 Echo--EF 60 to 65%, no WMA, grade 1 DD, PASP 39.7, trivial MR/TR -Daily weights--NEG 5 lbs since admission (215>>210) -She appears clinically fluid overloaded with JVD and peripheral edema   Dyspnea -This is likely multifactorial including patient's fluid overload, large hiatus hernia, and deconditioning -CTA chest neg for PE -11/28 VBG 7.400/45/<35 on RA   Large hiatal hernia -I have made a referral to [facility] so she can discuss operative management.  Appointment has been made for 01/29/2021   Psoriasis -Followed by dermatologist, Dr. Margo Aye -Reports was recently  started on Otezla approximately 2 weeks ago -She discussed her shortness of breath with Dr. Margo Aye who recommended to stop [facility] [place] for now until etiology of shortness of breath is determined   Hypertension -She was on ARB as well as amlodipine -Blood pressures appear to be running on the lower end today -discontinue amlodipine   Lower extremity edema -Venous Dopplers negative for DVT -partly related to amlodipine -We will provide TED hose -Echo as above -Advised to keep her legs elevated   Hyperlipidemia -Continue statin   GERD -Continue PPI   Elevated D-dimer -Imaging negative for DVT/PE -ultrasound legs neg   Morbid obesity -BMI 42.1 -Counseled on importance of diet and exercise               Status is: inpatient   The patient will require care spanning > 2 midnights and should be moved to inpatient because: due to severity of illness requiring IV lasix             Family Communication:   no Family at bedside   Consultants:  none   Code Status:  FULL   DVT Prophylaxis:  [place] Lovenox     Procedures: As Listed in Progress Note Above   Antibiotics: None        Subjective: Patient states she is breathing better than yesterday.  Stil has some dyspnea on exertion.  Denies f/c, cp, n/v/d  Objective: Vitals:   01/16/21 2300 01/17/21 0500 01/17/21 1300 01/17/21 1900  BP: (!) 140/58 (!) 142/56 (!) 141/59 116/62  Pulse: 81 74 82  Resp:   19   Temp: 98.9 F (37.2 C) 99 F (37.2 C) 97.9 F (36.6 C)   TempSrc: Oral Oral Oral   SpO2: 94% 92% 97%   Weight:      Height:        Intake/Output Summary (Last 24 hours) at 01/18/2021 1720 Last data filed at 01/18/2021 1000 Gross per 24 hour  Intake 480 ml  Output --  Net 480 ml   Weight change:  Exam:  General:  Pt is alert, follows commands appropriately, not in acute distress HEENT: No icterus, No thrush, No neck mass, [place]/AT Cardiovascular: RRR, S1/S2, no rubs, no gallops Respiratory: fine  bibasilar crackles. No wheeze Abdomen: Soft/+BS, non tender, non distended, no guarding Extremities: 1 + LE edema, No lymphangitis, No petechiae, No rashes, no synovitis   Data Reviewed: I have personally reviewed following labs and imaging studies Basic Metabolic Panel: Recent Labs  Lab 01/15/21 1643 01/16/21 0528 01/18/21 0513  NA 142 143 141  K 4.1 4.1 3.7  CL 106 109 108  CO2 GLUCOSE 115* 86 95  BUN CREATININE 1.32* 1.08* 1.29*  CALCIUM 9.6 9.3 8.4*  MG  --  1.8 1.9   Liver Function Tests: Recent Labs  Lab 01/16/21 0528  AST 22  ALT 15  ALKPHOS 43  BILITOT 1.2  PROT 5.7*  ALBUMIN 3.1*   No results for input(s): LIPASE, AMYLASE in the last 168 hours. No results for input(s): AMMONIA in the last 168 hours. Coagulation Profile: Recent Labs  Lab 01/16/21 0528  INR 1.1   CBC: Recent Labs  Lab 01/15/21 1643 01/16/21 0528  WBC 8.9 6.8  NEUTROABS  --  3.9  HGB 12.1 10.8*  HCT 39.0 35.4*  MCV 92.4 93.2  PLT 224 184   Cardiac Enzymes: No results for input(s): CKTOTAL, CKMB, CKMBINDEX, TROPONINI in the last 168 hours. BNP: Invalid input(s): POCBNP CBG: No results for input(s): GLUCAP in the last 168 hours. HbA1C: No results for input(s): HGBA1C in the last 72 hours. Urine analysis: No results found for: COLORURINE, APPEARANCEUR, LABSPEC, PHURINE, GLUCOSEU, HGBUR, BILIRUBINUR, KETONESUR, PROTEINUR, UROBILINOGEN, NITRITE, LEUKOCYTESUR Sepsis Labs: (procalcitonin:4,lacticidven:4) ) Recent Results (from the past 240 hour(s))  Resp Panel by RT-PCR (Flu A&B, Covid) Nasopharyngeal Swab     Status: None   Collection Time: 01/15/21  8:45 PM   Specimen: Nasopharyngeal Swab; Nasopharyngeal(NP) swabs in vial transport medium  Result Value Ref Range Status   SARS Coronavirus 2 by RT PCR NEGATIVE NEGATIVE Final    Comment: (NOTE) SARS-CoV-2 target nucleic acids are NOT DETECTED.  The SARS-CoV-2 RNA is generally detectable in upper  respiratory specimens during the acute phase of infection. The lowest concentration of SARS-CoV-2 viral copies this assay can detect is 138 copies/mL. A negative result does not preclude SARS-Cov-2 infection and should not be used as the sole basis for treatment or other patient management decisions. A negative result may occur with  improper specimen collection/handling, submission of specimen other than nasopharyngeal swab, presence of viral mutation(s) within the areas targeted by this assay, and inadequate number of viral copies(<138 copies/mL). A negative result must be combined with clinical observations, patient history, and epidemiological information. The expected result is Negative.  Fact Sheet for Patients:  BloggerCourse.com  Fact Sheet for Healthcare Providers:  SeriousBroker.it  This test is no t yet approved or cleared by the [place] FDA and  has been authorized for detection and/or diagnosis of SARS-CoV-2 by  FDA under an Emergency Use Authorization (EUA). This EUA will remain  in effect (meaning this test can be used) for the duration of the COVID-19 declaration under Section 564(b)(1) of the Act, 21 U.S.C.section 360bbb-3(b)(1), unless the authorization is terminated  or revoked sooner.       Influenza A by PCR NEGATIVE NEGATIVE Final   Influenza B by PCR NEGATIVE NEGATIVE Final    Comment: (NOTE) The Xpert Xpress SARS-CoV-2/FLU/RSV plus assay is intended as an aid in the diagnosis of influenza from Nasopharyngeal swab specimens and should not be used as a sole basis for treatment. Nasal washings and aspirates are unacceptable for Xpert Xpress SARS-CoV-2/FLU/RSV testing.  Fact Sheet for Patients: BloggerCourse.com  Fact Sheet for Healthcare Providers: SeriousBroker.it  This test is not yet approved or cleared by the [place] FDA and has been  authorized for detection and/or diagnosis of SARS-CoV-2 by FDA under an Emergency Use Authorization (EUA). This EUA will remain in effect (meaning this test can be used) for the duration of the COVID-19 declaration under Section 564(b)(1) of the Act, 21 U.S.C. section 360bbb-3(b)(1), unless the authorization is terminated or revoked.  Performed at [facility], [address], [place], [place] [address]      Scheduled Meds:  atorvastatin  20 mg Oral Daily   enoxaparin (LOVENOX) injection  40 mg Subcutaneous Q24H   furosemide  40 mg Intravenous Daily   irbesartan  150 mg Oral Daily   Continuous Infusions:  Procedures/Studies: DG Chest 2 View  Result Date: 01/15/2021 CLINICAL DATA:  Shortness of breath. EXAM: CHEST - 2 VIEW COMPARISON:  None. FINDINGS: There is a moderate-sized left diaphragmatic hernia and large hiatal hernia. There is no definite lung consolidation, pleural effusion or pneumothorax identified. Cardiomediastinal silhouette is grossly within normal limits. No acute fractures are seen. There is a right shoulder arthroplasty. IMPRESSION: 1. There is a moderate-sized left diaphragmatic hernia and large hiatal hernia. This may be contributing to patient's shortness of breath. 2. There is no definite focal lung infiltrate. Electronically Signed   By: Darliss Cheney M.D.   On: 01/15/2021 17:27   CT Angio Chest PE W and/or Wo Contrast  Result Date: 01/15/2021 CLINICAL DATA:  PE suspected positive D-dimer EXAM: CT ANGIOGRAPHY CHEST WITH CONTRAST TECHNIQUE: Multidetector CT imaging of the chest was performed using the standard protocol during bolus administration of intravenous contrast. Multiplanar CT image reconstructions and MIPs were obtained to evaluate the vascular anatomy. CONTRAST:  OMNIPAQUE IOHEXOL 350 MG/ML SOLN COMPARISON:  None. FINDINGS: Cardiovascular: No filling defects within the pulmonary arteries to suggest acute pulmonary embolism. Suboptimal exam due to body  habitus. Mediastinum/Nodes: No axillary or supraclavicular adenopathy. No mediastinal or hilar adenopathy. No pericardial fluid. Esophagus normal. Large diaphragmatic hernia with the near entirety of the stomach and the transverse colon above the hemidiaphragms posterior the heart. Lungs/Pleura: No pulmonary infarction. No pulmonary edema. Mild LEFT basilar atelectasis associated to the elevated hemidiaphragm. Upper Abdomen: Limited view of the liver, kidneys, pancreas are unremarkable. Normal adrenal glands. Large hiatal hernia as above. Musculoskeletal: No aggressive osseous lesion identified. Review of the MIP images confirms the above findings. IMPRESSION: 1. No acute pulmonary embolism. Suboptimal exam due to body habitus. 2. 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 Electronically Signed   By: Genevive Bi M.D.   On: 01/15/2021 21:44   US Venous Img Lower Bilateral (DVT)  Result Date: 01/16/2021 CLINICAL DATA:  Increased D-dimer EXAM: BILATERAL LOWER EXTREMITY VENOUS DOPPLER ULTRASOUND TECHNIQUE: Gray-scale sonography with graded compression, as well as color Doppler and duplex ultrasound were performed to evaluate the lower extremity deep venous systems from the level of the common femoral vein and including the common femoral, femoral, profunda femoral, popliteal and calf veins including the posterior tibial, peroneal and gastrocnemius veins when visible. The superficial great saphenous vein was also interrogated. Spectral Doppler was utilized to evaluate flow at rest and with distal augmentation maneuvers in the common femoral, femoral and popliteal veins. COMPARISON:  None. FINDINGS: RIGHT LOWER EXTREMITY Common Femoral Vein: No evidence of thrombus. Normal compressibility, respiratory phasicity and response to augmentation. Saphenofemoral Junction: No evidence of thrombus. Normal compressibility and flow on  color Doppler imaging. Profunda Femoral Vein: No evidence of thrombus. Normal compressibility and flow on color Doppler imaging. Femoral Vein: No evidence of thrombus. Normal compressibility, respiratory phasicity and response to augmentation. Popliteal Vein: No evidence of thrombus. Normal compressibility, respiratory phasicity and response to augmentation. Calf Veins: No evidence of thrombus. Normal compressibility and flow on color Doppler imaging. Superficial Great Saphenous Vein: No evidence of thrombus. Normal compressibility. Venous Reflux:  None. Other Findings:  Superficial edema in the calf. LEFT LOWER EXTREMITY Common Femoral Vein: No evidence of thrombus. Normal compressibility, respiratory phasicity and response to augmentation. Saphenofemoral Junction: No evidence of thrombus. Normal compressibility and flow on color Doppler imaging. Profunda Femoral Vein: No evidence of thrombus. Normal compressibility and flow on color Doppler imaging. Femoral Vein: No evidence of thrombus. Normal compressibility, respiratory phasicity and response to augmentation. Popliteal Vein: No evidence of thrombus. Normal compressibility, respiratory phasicity and response to augmentation. Calf Veins: No evidence of thrombus. Normal compressibility and flow on color Doppler imaging. Superficial Great Saphenous Vein: No evidence of thrombus. Normal compressibility. Venous Reflux:  None. Other Findings:  Superficial edema in the calf. IMPRESSION: No evidence of deep venous thrombosis in either lower extremity. Electronically Signed   By: Caprice Renshaw M.D.   On: 01/16/2021 10:04   ECHOCARDIOGRAM COMPLETE  Result Date: 01/16/2021    ECHOCARDIOGRAM REPORT   Patient Name:   TIFFINIE CAILLIER Date of Exam: 01/16/2021 Medical Rec #:  [id number]     Height:       60.0 in Accession #:    [id number]    Weight:       215.8 lb Date of Birth:  05-17-1940     BSA:          1.928 m Patient Age:    80 years      BP:           94/58 mmHg Patient  Gender: F             HR:           56 bpm. [facility]am [facility] Procedure: 2D Echo, Cardiac Doppler and Color Doppler Indications:    Dyspnea  History:        Patient has no prior history of Echocardiogram examinations.                 TIA, Signs/Symptoms:Shortness of Breath; Risk                 Factors:Hypertension and Dyslipidemia.  Sonographer:    Mikki Harbor Referring Phys: [phone] JEHANZEB MEMON IMPRESSIONS  1. Left ventricular ejection fraction, by estimation, is 60 to 65%. The left ventricle has normal function. The left ventricle has no regional wall motion abnormalities. There is mild asymmetric  left ventricular hypertrophy of the basal-septal segment. Left ventricular diastolic parameters are consistent with Grade I diastolic dysfunction (impaired relaxation).  2. Right ventricular systolic function is normal. The right ventricular size is normal. There is mildly elevated pulmonary artery systolic pressure. The estimated right ventricular systolic pressure is 39.7 mmHg.  3. The mitral valve is degenerative. Trivial mitral valve regurgitation.  4. The aortic valve is tricuspid. There is mild calcification of the aortic valve. There is mild thickening of the aortic valve. Aortic valve regurgitation is not visualized. Aortic valve sclerosis is present, with no evidence of aortic valve stenosis.  5. The inferior vena cava is normal in size with greater than 50% respiratory variability, suggesting right atrial pressure of 3 mmHg. Comparison(s): No prior Echocardiogram. FINDINGS  Left Ventricle: Left ventricular ejection fraction, by estimation, is 60 to 65%. The left ventricle has normal function. The left ventricle has no regional wall motion abnormalities. The left ventricular internal cavity size was normal in size. There is  mild asymmetric left ventricular hypertrophy of the basal-septal segment. Left ventricular diastolic parameters are consistent with Grade I diastolic dysfunction (impaired  relaxation). Right Ventricle: The right ventricular size is normal. No increase in right ventricular wall thickness. Right ventricular systolic function is normal. There is mildly elevated pulmonary artery systolic pressure. The tricuspid regurgitant velocity is 3.03  m/s, and with an assumed right atrial pressure of 3 mmHg, the estimated right ventricular systolic pressure is 39.7 mmHg. Left Atrium: Left atrial size was normal in size. Right Atrium: Right atrial size was normal in size. Pericardium: There is no evidence of pericardial effusion. Mitral Valve: The mitral valve is degenerative in appearance. There is mild thickening of the mitral valve leaflet(s). There is mild calcification of the mitral valve leaflet(s). Mild to moderate mitral annular calcification. Trivial mitral valve regurgitation. MV peak gradient, 5.7 mmHg. The mean mitral valve gradient is 2.0 mmHg. Tricuspid Valve: The tricuspid valve is normal in structure. Tricuspid valve regurgitation is trivial. Aortic Valve: The aortic valve is tricuspid. There is mild calcification of the aortic valve. There is mild thickening of the aortic valve. Aortic valve regurgitation is not visualized. Aortic valve sclerosis is present, with no evidence of aortic valve stenosis. Aortic valve mean gradient measures 7.0 mmHg. Aortic valve peak gradient measures 16.3 mmHg. Aortic valve area, by VTI measures 2.61 cm. Pulmonic Valve: The pulmonic valve was normal in structure. Pulmonic valve regurgitation is mild. Aorta: The aortic root is normal in size and structure. Venous: The inferior vena cava is normal in size with greater than 50% respiratory variability, suggesting right atrial pressure of 3 mmHg. IAS/Shunts: No atrial level shunt detected by color flow Doppler.  LEFT VENTRICLE PLAX 2D LVIDd:         4.90 cm     Diastology LVIDs:         3.10 cm     LV e' medial:    6.12 cm/s LV PW:         0.90 cm     LV E/e' medial:  15.5 LV IVS:        1.20 cm     LV e'  lateral:   8.12 cm/s LVOT diam:     1.90 cm     LV E/e' lateral: 11.7 LV SV:         97 LV SV Index:   50 LVOT Area:     2.84 cm  LV Volumes (MOD) LV vol d, MOD A2C: 65.3 ml  LV vol d, MOD A4C: 67.5 ml LV vol s, MOD A2C: 29.7 ml LV vol s, MOD A4C: 20.8 ml LV SV MOD A2C:     35.6 ml LV SV MOD A4C:     67.5 ml LV SV MOD BP:      43.5 ml RIGHT VENTRICLE RV Basal diam:  3.20 cm RV Mid diam:    2.10 cm RV S prime:     16.50 cm/s TAPSE (M-mode): 2.8 cm LEFT ATRIUM             Index        RIGHT ATRIUM           Index LA diam:        3.80 cm 1.97 cm/m   RA Area:     13.20 cm LA Vol (A2C):   54.7 ml 28.37 ml/m  RA Volume:   30.30 ml  15.72 ml/m LA Vol (A4C):   62.9 ml 32.62 ml/m LA Biplane Vol: 58.1 ml 30.13 ml/m  AORTIC VALVE                     PULMONIC VALVE AV Area (Vmax):    2.12 cm      PV Vmax:       1.04 m/s AV Area (Vmean):   2.14 cm      PV Peak grad:  4.3 mmHg AV Area (VTI):     2.61 cm AV Vmax:           202.00 cm/s AV Vmean:          124.000 cm/s AV VTI:            0.370 m AV Peak Grad:      16.3 mmHg AV Mean Grad:      7.0 mmHg LVOT Vmax:         151.00 cm/s LVOT Vmean:        93.500 cm/s LVOT VTI:          0.341 m LVOT/AV VTI ratio: 0.92  AORTA Ao Root diam: 3.00 cm MITRAL VALVE               TRICUSPID VALVE MV Area (PHT): 2.81 cm    TR Peak grad:   36.7 mmHg MV Area VTI:   2.40 cm    TR Vmax:        303.00 cm/s MV Peak grad:  5.7 mmHg MV Mean grad:  2.0 mmHg    SHUNTS MV Vmax:       1.19 m/s    Systemic VTI:  0.34 m MV Vmean:      71.9 cm/s   Systemic Diam: 1.90 cm MV Decel Time: 270 msec MV E velocity: 94.60 cm/s MV A velocity: 93.60 cm/s MV E/A ratio:  1.01 Laurance Flatten MD Electronically signed by Laurance Flatten MD Signature Date/Time: 01/16/2021/5:39:40 PM    Final     Catarina Hartshorn, DO  [facility]  If 7PM-7AM, please contact night-coverage [email or web address] Password [email or web address] 01/18/2021, 5:20 PM   LOS: 1 day

## 2021-01-18 NOTE — Plan of Care (Signed)
  Problem: Education: Goal: Knowledge of General Education information will improve Description Including pain rating scale, medication(s)/side effects and non-pharmacologic comfort measures Outcome: Progressing   Problem: Health Behavior/Discharge Planning: Goal: Ability to manage health-related needs will improve Outcome: Progressing   

## 2021-01-19 DIAGNOSIS — R0600 Dyspnea, unspecified: Secondary | ICD-10-CM

## 2021-01-19 LAB — BASIC METABOLIC PANEL
Anion gap: 9 (ref 5–15)
BUN: 12 mg/dL (ref 8–23)
CO2: 25 mmol/L (ref 22–32)
Calcium: 8.2 mg/dL — ABNORMAL LOW (ref 8.9–10.3)
Chloride: 107 mmol/L (ref 98–111)
Creatinine, Ser: 1.45 mg/dL — ABNORMAL HIGH (ref 0.44–1.00)
GFR, Estimated: 37 mL/min — ABNORMAL LOW (ref >60)
Glucose, Bld: 97 mg/dL (ref 70–99)
Potassium: 3.8 mmol/L (ref 3.5–5.1)
Sodium: 141 mmol/L (ref 135–145)

## 2021-01-19 MED ORDER — FUROSEMIDE 40 MG PO TABS
40.0000 mg | ORAL_TABLET | Freq: Every day | ORAL | Status: DC
  Administered 2021-01-19: 40 mg via ORAL
  Filled 2021-01-19: qty 1

## 2021-01-19 MED ORDER — MENTHOL 3 MG MT LOZG: 1.0000 | LOZENGE | OROMUCOSAL | Status: DC | PRN

## 2021-01-19 MED ORDER — FUROSEMIDE 40 MG PO TABS: 40.0000 mg | ORAL_TABLET | Freq: Every day | ORAL | 1 refills | Status: AC

## 2021-01-19 NOTE — Discharge Summary (Signed)
Physician Discharge Summary  Destiny Barajas WUJ:[id number] DOB: October 24, 1940 DOA: 01/15/2021  PCP: Craig Staggers, MD  Admit date: 01/15/2021 Discharge date: 01/19/2021  Admitted From: Home Disposition:  Home  Recommendations for Outpatient Follow-up:  Follow up with PCP in 1-2 weeks Please obtain BMP/CBC in one week    Discharge Condition: Stable CODE STATUS:FULL Diet recommendation: Heart Healthy   Brief/Interim Summary: 80 year old female with a history of hypertension, hyperlipidemia, diabetes mellitus type 2, GERD presenting with 2-week history of shortness of breath that was worse over the last 4 days prior to admission.  The patient states that she was placed on furosemide 20 mg daily by her PCP which she took about 2 weeks.  She stated that it did not really help her shortness of breath or edema.  In addition, the patient has been complaining of lower extremity edema and increasing abdominal girth for the past 3 to 4 weeks.  She denies any frank orthopnea.  She states that she has OSA, but is unable to use a CPAP machine.  She states that she has gained about 7 pounds over the past month.  She denies any fevers, chills, headache, neck pain, chest pain, nausea, vomiting, diarrhea.  In the emergency department, the patient was noted to be tachypneic and hypoxic with ambulation.  CTA chest was performed and was negative for pulmonary embolus but showed a large diaphragmatic hernia.  The patient was admitted for further diagnostic work-up and treatment of her dyspnea.  Discharge Diagnoses:  Acute Diastolic CHF -Felt that this is primarily right heart mediated -Continue IV furosemide 40 mg daily>>d/c home with lasix 40 mg po daily -11/29 Echo--EF 60 to 65%, no WMA, grade 1 DD, PASP 39.7, trivial MR/TR -Daily weights--NEG 5 lbs since admission (215>>210) -She appears clinically fluid overloaded with JVD and peripheral edema   Dyspnea -This is likely multifactorial including  patient's fluid overload, large hiatus hernia, and deconditioning -CTA chest neg for PE -11/28 VBG 7.400/45/<35 on RA -overall improved- -pt states breathing is "almost normal" at time of d/c -ambulated on RA on day of d/c>>no desaturation <92   Large hiatal hernia -Dr. Kerry Hough had made a referral to [facility] surgery so she can discuss operative management. -Appointment has been made for 01/29/2021   Psoriasis -Followed by dermatologist, Dr. Margo Aye -Reports was recently started on Otezla approximately 2 weeks ago -She discussed her shortness of breath with Dr. Margo Aye who recommended to stop [place] for now until etiology of shortness of breath is determined -restart clobetasol and cutivate after d/c   Hypertension -She was on ARB as well as amlodipine -Blood pressures appear to be running on the lower end initially -d/c ARB due to uptrend serum creatinine -restart amlodipine   Lower extremity edema -Venous Dopplers negative for DVT -partly related to amlodipine -We will provide TED hose -Echo as above -Advised to keep her legs elevated   Hyperlipidemia -Continue statin   GERD -Continue PPI   Elevated D-dimer -Imaging negative for DVT/PE -ultrasound legs neg for DVT   Morbid obesity -BMI 42.1 -Counseled on importance of diet and exercise         Discharge Instructions   Allergies as of 01/19/2021       Reactions   Prochlorperazine Shortness Of Breath   Prochlorperazine Edisylate Shortness Of Breath, Anaphylaxis   Other reaction(s): Respiratory distress        Medication List     STOP taking these medications    clobetasol cream 0.05 % Commonly known as:  TEMOVATE   gabapentin 100 MG capsule Commonly known as: NEURONTIN   hydrochlorothiazide 12.5 MG tablet Commonly known as: HYDRODIURIL   ketoconazole 2 % shampoo Commonly known as: NIZORAL   nortriptyline 10 MG capsule Commonly known as: PAMELOR   triamcinolone ointment 0.1 % Commonly  known as: KENALOG   valsartan 160 MG tablet Commonly known as: DIOVAN       TAKE these medications    amLODipine 10 MG tablet Commonly known as: NORVASC Take 10 mg by mouth daily.   atorvastatin 40 MG tablet Commonly known as: LIPITOR Take 20 mg by mouth daily.   Biotin 16109 MCG Tabs Take 1 tablet by mouth daily.   calcium carbonate 1250 (500 Ca) MG chewable tablet Commonly known as: OS-CAL Chew 1 tablet by mouth daily. Not sure of dose but takes daily OTC calcium   cholecalciferol 25 MCG (1000 UNIT) tablet Commonly known as: VITAMIN D3 Take 1,000 Units by mouth daily.   Fluocinolone Acetonide Scalp 0.01 % Oil Apply topically at bedtime.   fluticasone 0.05 % cream Commonly known as: CUTIVATE Apply 1 application topically daily.   furosemide 40 MG tablet Commonly known as: LASIX Take 1 tablet (40 mg total) by mouth daily. Start taking on: January 20, 2021 What changed:  medication strength how much to take   OTEZLA PO Take 30 mg by mouth 2 (two) times daily.   vitamin C 500 MG tablet Commonly known as: ASCORBIC ACID Take 500 mg by mouth daily.        Follow-up Information     Axel Filler, MD Follow up on 01/29/2021.   Specialty: General Surgery Why: 1:20pm To discuss operative management of hiatal hernia Contact information: [address] [place] [address] [phone]                Allergies  Allergen Reactions   Prochlorperazine Shortness Of Breath   Prochlorperazine Edisylate Shortness Of Breath and Anaphylaxis    Other reaction(s): Respiratory distress    Consultations: none   Procedures/Studies: DG Chest 2 View  Result Date: 01/15/2021 CLINICAL DATA:  Shortness of breath. EXAM: CHEST - 2 VIEW COMPARISON:  None. FINDINGS: There is a moderate-sized left diaphragmatic hernia and large hiatal hernia. There is no definite lung consolidation, pleural effusion or pneumothorax identified. Cardiomediastinal  silhouette is grossly within normal limits. No acute fractures are seen. There is a right shoulder arthroplasty. IMPRESSION: 1. There is a moderate-sized left diaphragmatic hernia and large hiatal hernia. This may be contributing to patient's shortness of breath. 2. There is no definite focal lung infiltrate. Electronically Signed   By: Darliss Cheney M.D.   On: 01/15/2021 17:27   CT Angio Chest PE W and/or Wo Contrast  Result Date: 01/15/2021 CLINICAL DATA:  PE suspected positive D-dimer EXAM: CT ANGIOGRAPHY CHEST WITH CONTRAST TECHNIQUE: Multidetector CT imaging of the chest was performed using the standard protocol during bolus administration of intravenous contrast. Multiplanar CT image reconstructions and MIPs were obtained to evaluate the vascular anatomy. CONTRAST:  OMNIPAQUE IOHEXOL 350 MG/ML SOLN COMPARISON:  None. FINDINGS: Cardiovascular: No filling defects within the pulmonary arteries to suggest acute pulmonary embolism. Suboptimal exam due to body habitus. Mediastinum/Nodes: No axillary or supraclavicular adenopathy. No mediastinal or hilar adenopathy. No pericardial fluid. Esophagus normal. Large diaphragmatic hernia with the near entirety of the stomach and the transverse colon above the hemidiaphragms posterior the heart. Lungs/Pleura: No pulmonary infarction. No pulmonary edema. Mild LEFT basilar atelectasis associated to the elevated  hemidiaphragm. Upper Abdomen: Limited view of the liver, kidneys, pancreas are unremarkable. Normal adrenal glands. Large hiatal hernia as above. Musculoskeletal: No aggressive osseous lesion identified. Review of the MIP images confirms the above findings. IMPRESSION: 1. No acute pulmonary embolism. Suboptimal exam due to body habitus. 2. 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 Electronically Signed   By: Genevive Bi M.D.   On: 01/15/2021 21:44   US Venous Img  Lower Bilateral (DVT)  Result Date: 01/16/2021 CLINICAL DATA:  Increased D-dimer EXAM: BILATERAL LOWER EXTREMITY VENOUS DOPPLER ULTRASOUND TECHNIQUE: Gray-scale sonography with graded compression, as well as color Doppler and duplex ultrasound were performed to evaluate the lower extremity deep venous systems from the level of the common femoral vein and including the common femoral, femoral, profunda femoral, popliteal and calf veins including the posterior tibial, peroneal and gastrocnemius veins when visible. The superficial great saphenous vein was also interrogated. Spectral Doppler was utilized to evaluate flow at rest and with distal augmentation maneuvers in the common femoral, femoral and popliteal veins. COMPARISON:  None. FINDINGS: RIGHT LOWER EXTREMITY Common Femoral Vein: No evidence of thrombus. Normal compressibility, respiratory phasicity and response to augmentation. Saphenofemoral Junction: No evidence of thrombus. Normal compressibility and flow on color Doppler imaging. Profunda Femoral Vein: No evidence of thrombus. Normal compressibility and flow on color Doppler imaging. Femoral Vein: No evidence of thrombus. Normal compressibility, respiratory phasicity and response to augmentation. Popliteal Vein: No evidence of thrombus. Normal compressibility, respiratory phasicity and response to augmentation. Calf Veins: No evidence of thrombus. Normal compressibility and flow on color Doppler imaging. Superficial Great Saphenous Vein: No evidence of thrombus. Normal compressibility. Venous Reflux:  None. Other Findings:  Superficial edema in the calf. LEFT LOWER EXTREMITY Common Femoral Vein: No evidence of thrombus. Normal compressibility, respiratory phasicity and response to augmentation. Saphenofemoral Junction: No evidence of thrombus. Normal compressibility and flow on color Doppler imaging. Profunda Femoral Vein: No evidence of thrombus. Normal compressibility and flow on color Doppler  imaging. Femoral Vein: No evidence of thrombus. Normal compressibility, respiratory phasicity and response to augmentation. Popliteal Vein: No evidence of thrombus. Normal compressibility, respiratory phasicity and response to augmentation. Calf Veins: No evidence of thrombus. Normal compressibility and flow on color Doppler imaging. Superficial Great Saphenous Vein: No evidence of thrombus. Normal compressibility. Venous Reflux:  None. Other Findings:  Superficial edema in the calf. IMPRESSION: No evidence of deep venous thrombosis in either lower extremity. Electronically Signed   By: Caprice Renshaw M.D.   On: 01/16/2021 10:04   ECHOCARDIOGRAM COMPLETE  Result Date: 01/16/2021    ECHOCARDIOGRAM REPORT   Patient Name:   Destiny Barajas Date of Exam: 01/16/2021 Medical Rec #:  [id number]     Height:       60.0 in Accession #:    [id number]    Weight:       215.8 lb Date of Birth:  Apr 10, 1940     BSA:          1.928 m Patient Age:    80 years      BP:           94/58 mmHg Patient Gender: F             HR:           56 bpm. [facility] Procedure: 2D Echo, Cardiac Doppler and Color Doppler Indications:    Dyspnea  History:  Patient has no prior history of Echocardiogram examinations.                 TIA, Signs/Symptoms:Shortness of Breath; Risk                 Factors:Hypertension and Dyslipidemia.  Sonographer:    Mikki Harbor Referring Phys: [phone] JEHANZEB MEMON IMPRESSIONS  1. Left ventricular ejection fraction, by estimation, is 60 to 65%. The left ventricle has normal function. The left ventricle has no regional wall motion abnormalities. There is mild asymmetric left ventricular hypertrophy of the basal-septal segment. Left ventricular diastolic parameters are consistent with Grade I diastolic dysfunction (impaired relaxation).  2. Right ventricular systolic function is normal. The right ventricular size is normal. There is mildly elevated pulmonary artery systolic pressure. The estimated  right ventricular systolic pressure is 39.7 mmHg.  3. The mitral valve is degenerative. Trivial mitral valve regurgitation.  4. The aortic valve is tricuspid. There is mild calcification of the aortic valve. There is mild thickening of the aortic valve. Aortic valve regurgitation is not visualized. Aortic valve sclerosis is present, with no evidence of aortic valve stenosis.  5. The inferior vena cava is normal in size with greater than 50% respiratory variability, suggesting right atrial pressure of 3 mmHg. Comparison(s): No prior Echocardiogram. FINDINGS  Left Ventricle: Left ventricular ejection fraction, by estimation, is 60 to 65%. The left ventricle has normal function. The left ventricle has no regional wall motion abnormalities. The left ventricular internal cavity size was normal in size. There is  mild asymmetric left ventricular hypertrophy of the basal-septal segment. Left ventricular diastolic parameters are consistent with Grade I diastolic dysfunction (impaired relaxation). Right Ventricle: The right ventricular size is normal. No increase in right ventricular wall thickness. Right ventricular systolic function is normal. There is mildly elevated pulmonary artery systolic pressure. The tricuspid regurgitant velocity is 3.03  m/s, and with an assumed right atrial pressure of 3 mmHg, the estimated right ventricular systolic pressure is 39.7 mmHg. Left Atrium: Left atrial size was normal in size. Right Atrium: Right atrial size was normal in size. Pericardium: There is no evidence of pericardial effusion. Mitral Valve: The mitral valve is degenerative in appearance. There is mild thickening of the mitral valve leaflet(s). There is mild calcification of the mitral valve leaflet(s). Mild to moderate mitral annular calcification. Trivial mitral valve regurgitation. MV peak gradient, 5.7 mmHg. The mean mitral valve gradient is 2.0 mmHg. Tricuspid Valve: The tricuspid valve is normal in structure. Tricuspid  valve regurgitation is trivial. Aortic Valve: The aortic valve is tricuspid. There is mild calcification of the aortic valve. There is mild thickening of the aortic valve. Aortic valve regurgitation is not visualized. Aortic valve sclerosis is present, with no evidence of aortic valve stenosis. Aortic valve mean gradient measures 7.0 mmHg. Aortic valve peak gradient measures 16.3 mmHg. Aortic valve area, by VTI measures 2.61 cm. Pulmonic Valve: The pulmonic valve was normal in structure. Pulmonic valve regurgitation is mild. Aorta: The aortic root is normal in size and structure. Venous: The inferior vena cava is normal in size with greater than 50% respiratory variability, suggesting right atrial pressure of 3 mmHg. IAS/Shunts: No atrial level shunt detected by color flow Doppler.  LEFT VENTRICLE PLAX 2D LVIDd:         4.90 cm     Diastology LVIDs:         3.10 cm     LV e' medial:    6.12 cm/s LV PW:  0.90 cm     LV E/e' medial:  15.5 LV IVS:        1.20 cm     LV e' lateral:   8.12 cm/s LVOT diam:     1.90 cm     LV E/e' lateral: 11.7 LV SV:         97 LV SV Index:   50 LVOT Area:     2.84 cm  LV Volumes (MOD) LV vol d, MOD A2C: 65.3 ml LV vol d, MOD A4C: 67.5 ml LV vol s, MOD A2C: 29.7 ml LV vol s, MOD A4C: 20.8 ml LV SV MOD A2C:     35.6 ml LV SV MOD A4C:     67.5 ml LV SV MOD BP:      43.5 ml RIGHT VENTRICLE RV Basal diam:  3.20 cm RV Mid diam:    2.10 cm RV S prime:     16.50 cm/s TAPSE (M-mode): 2.8 cm LEFT ATRIUM             Index        RIGHT ATRIUM           Index LA diam:        3.80 cm 1.97 cm/m   RA Area:     13.20 cm LA Vol (A2C):   54.7 ml 28.37 ml/m  RA Volume:   30.30 ml  15.72 ml/m LA Vol (A4C):   62.9 ml 32.62 ml/m LA Biplane Vol: 58.1 ml 30.13 ml/m  AORTIC VALVE                     PULMONIC VALVE AV Area (Vmax):    2.12 cm      PV Vmax:       1.04 m/s AV Area (Vmean):   2.14 cm      PV Peak grad:  4.3 mmHg AV Area (VTI):     2.61 cm AV Vmax:           202.00 cm/s AV Vmean:           124.000 cm/s AV VTI:            0.370 m AV Peak Grad:      16.3 mmHg AV Mean Grad:      7.0 mmHg LVOT Vmax:         151.00 cm/s LVOT Vmean:        93.500 cm/s LVOT VTI:          0.341 m LVOT/AV VTI ratio: 0.92  AORTA Ao Root diam: 3.00 cm MITRAL VALVE               TRICUSPID VALVE MV Area (PHT): 2.81 cm    TR Peak grad:   36.7 mmHg MV Area VTI:   2.40 cm    TR Vmax:        303.00 cm/s MV Peak grad:  5.7 mmHg MV Mean grad:  2.0 mmHg    SHUNTS MV Vmax:       1.19 m/s    Systemic VTI:  0.34 m MV Vmean:      71.9 cm/s   Systemic Diam: 1.90 cm MV Decel Time: 270 msec MV E velocity: 94.60 cm/s MV A velocity: 93.60 cm/s MV E/A ratio:  1.01 Laurance Flatten MD Electronically signed by Laurance Flatten MD Signature Date/Time: 01/16/2021/5:39:40 PM    Final         Discharge Exam: Vitals:   01/18/21 2236 01/19/21  0430  BP: 140/70 (!) 142/60  Pulse: 87 78  Resp: 20 16  Temp: 98.7 F (37.1 C) 98.5 F (36.9 C)  SpO2: 91% 93%   Vitals:   01/17/21 1900 01/18/21 2236 01/19/21 0430 01/19/21 0731  BP: 116/62 140/70 (!) 142/60   Pulse:  87 78   Resp:  20 16   Temp:  98.7 F (37.1 C) 98.5 F (36.9 C)   TempSrc:  Oral Oral   SpO2:  91% 93%   Weight:    98.8 kg  Height:        General: Pt is alert, awake, not in acute distress Cardiovascular: RRR, S1/S2 +, no rubs, no gallops Respiratory: CTA bilaterally, no wheezing, no rhonchi Abdominal: Soft, NT, ND, bowel sounds + Extremities: 1+LE edema, no cyanosis   The results of significant diagnostics from this hospitalization (including imaging, microbiology, ancillary and laboratory) are listed below for reference.    Significant Diagnostic Studies: DG Chest 2 View  Result Date: 01/15/2021 CLINICAL DATA:  Shortness of breath. EXAM: CHEST - 2 VIEW COMPARISON:  None. FINDINGS: There is a moderate-sized left diaphragmatic hernia and large hiatal hernia. There is no definite lung consolidation, pleural effusion or pneumothorax identified.  Cardiomediastinal silhouette is grossly within normal limits. No acute fractures are seen. There is a right shoulder arthroplasty. IMPRESSION: 1. There is a moderate-sized left diaphragmatic hernia and large hiatal hernia. This may be contributing to patient's shortness of breath. 2. There is no definite focal lung infiltrate. Electronically Signed   By: Darliss Cheney M.D.   On: 01/15/2021 17:27   CT Angio Chest PE W and/or Wo Contrast  Result Date: 01/15/2021 CLINICAL DATA:  PE suspected positive D-dimer EXAM: CT ANGIOGRAPHY CHEST WITH CONTRAST TECHNIQUE: Multidetector CT imaging of the chest was performed using the standard protocol during bolus administration of intravenous contrast. Multiplanar CT image reconstructions and MIPs were obtained to evaluate the vascular anatomy. CONTRAST:  OMNIPAQUE IOHEXOL 350 MG/ML SOLN COMPARISON:  None. FINDINGS: Cardiovascular: No filling defects within the pulmonary arteries to suggest acute pulmonary embolism. Suboptimal exam due to body habitus. Mediastinum/Nodes: No axillary or supraclavicular adenopathy. No mediastinal or hilar adenopathy. No pericardial fluid. Esophagus normal. Large diaphragmatic hernia with the near entirety of the stomach and the transverse colon above the hemidiaphragms posterior the heart. Lungs/Pleura: No pulmonary infarction. No pulmonary edema. Mild LEFT basilar atelectasis associated to the elevated hemidiaphragm. Upper Abdomen: Limited view of the liver, kidneys, pancreas are unremarkable. Normal adrenal glands. Large hiatal hernia as above. Musculoskeletal: No aggressive osseous lesion identified. Review of the MIP images confirms the above findings. IMPRESSION: 1. No acute pulmonary embolism. Suboptimal exam due to body habitus. 2. 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 Electronically Signed   By: Genevive Bi M.D.   On: 01/15/2021 21:44    US Venous Img Lower Bilateral (DVT)  Result Date: 01/16/2021 CLINICAL DATA:  Increased D-dimer EXAM: BILATERAL LOWER EXTREMITY VENOUS DOPPLER ULTRASOUND TECHNIQUE: Gray-scale sonography with graded compression, as well as color Doppler and duplex ultrasound were performed to evaluate the lower extremity deep venous systems from the level of the common femoral vein and including the common femoral, femoral, profunda femoral, popliteal and calf veins including the posterior tibial, peroneal and gastrocnemius veins when visible. The superficial great saphenous vein was also interrogated. Spectral Doppler was utilized to evaluate flow at rest and with distal augmentation maneuvers in the common femoral, femoral  and popliteal veins. COMPARISON:  None. FINDINGS: RIGHT LOWER EXTREMITY Common Femoral Vein: No evidence of thrombus. Normal compressibility, respiratory phasicity and response to augmentation. Saphenofemoral Junction: No evidence of thrombus. Normal compressibility and flow on color Doppler imaging. Profunda Femoral Vein: No evidence of thrombus. Normal compressibility and flow on color Doppler imaging. Femoral Vein: No evidence of thrombus. Normal compressibility, respiratory phasicity and response to augmentation. Popliteal Vein: No evidence of thrombus. Normal compressibility, respiratory phasicity and response to augmentation. Calf Veins: No evidence of thrombus. Normal compressibility and flow on color Doppler imaging. Superficial Great Saphenous Vein: No evidence of thrombus. Normal compressibility. Venous Reflux:  None. Other Findings:  Superficial edema in the calf. LEFT LOWER EXTREMITY Common Femoral Vein: No evidence of thrombus. Normal compressibility, respiratory phasicity and response to augmentation. Saphenofemoral Junction: No evidence of thrombus. Normal compressibility and flow on color Doppler imaging. Profunda Femoral Vein: No evidence of thrombus. Normal compressibility and flow on  color Doppler imaging. Femoral Vein: No evidence of thrombus. Normal compressibility, respiratory phasicity and response to augmentation. Popliteal Vein: No evidence of thrombus. Normal compressibility, respiratory phasicity and response to augmentation. Calf Veins: No evidence of thrombus. Normal compressibility and flow on color Doppler imaging. Superficial Great Saphenous Vein: No evidence of thrombus. Normal compressibility. Venous Reflux:  None. Other Findings:  Superficial edema in the calf. IMPRESSION: No evidence of deep venous thrombosis in either lower extremity. Electronically Signed   By: Caprice Renshaw M.D.   On: 01/16/2021 10:04   ECHOCARDIOGRAM COMPLETE  Result Date: 01/16/2021    ECHOCARDIOGRAM REPORT   Patient Name:   Destiny Barajas Date of Exam: 01/16/2021 Medical Rec #:  [id number]     Height:       60.0 in Accession #:    [id number]    Weight:       215.8 lb Date of Birth:  1940/08/13     BSA:          1.928 m Patient Age:    80 years      BP:           94/58 mmHg Patient Gender: F             HR:           56 bpm. [facility] Procedure: 2D Echo, Cardiac Doppler and Color Doppler Indications:    Dyspnea  History:        Patient has no prior history of Echocardiogram examinations.                 TIA, Signs/Symptoms:Shortness of Breath; Risk                 Factors:Hypertension and Dyslipidemia.  Sonographer:    Mikki Harbor Referring Phys: [phone] JEHANZEB MEMON IMPRESSIONS  1. Left ventricular ejection fraction, by estimation, is 60 to 65%. The left ventricle has normal function. The left ventricle has no regional wall motion abnormalities. There is mild asymmetric left ventricular hypertrophy of the basal-septal segment. Left ventricular diastolic parameters are consistent with Grade I diastolic dysfunction (impaired relaxation).  2. Right ventricular systolic function is normal. The right ventricular size is normal. There is mildly elevated pulmonary artery systolic pressure.  The estimated right ventricular systolic pressure is 39.7 mmHg.  3. The mitral valve is degenerative. Trivial mitral valve regurgitation.  4. The aortic valve is tricuspid. There is mild calcification of the aortic valve. There is mild thickening of the aortic valve. Aortic valve regurgitation is not  visualized. Aortic valve sclerosis is present, with no evidence of aortic valve stenosis.  5. The inferior vena cava is normal in size with greater than 50% respiratory variability, suggesting right atrial pressure of 3 mmHg. Comparison(s): No prior Echocardiogram. FINDINGS  Left Ventricle: Left ventricular ejection fraction, by estimation, is 60 to 65%. The left ventricle has normal function. The left ventricle has no regional wall motion abnormalities. The left ventricular internal cavity size was normal in size. There is  mild asymmetric left ventricular hypertrophy of the basal-septal segment. Left ventricular diastolic parameters are consistent with Grade I diastolic dysfunction (impaired relaxation). Right Ventricle: The right ventricular size is normal. No increase in right ventricular wall thickness. Right ventricular systolic function is normal. There is mildly elevated pulmonary artery systolic pressure. The tricuspid regurgitant velocity is 3.03  m/s, and with an assumed right atrial pressure of 3 mmHg, the estimated right ventricular systolic pressure is 39.7 mmHg. Left Atrium: Left atrial size was normal in size. Right Atrium: Right atrial size was normal in size. Pericardium: There is no evidence of pericardial effusion. Mitral Valve: The mitral valve is degenerative in appearance. There is mild thickening of the mitral valve leaflet(s). There is mild calcification of the mitral valve leaflet(s). Mild to moderate mitral annular calcification. Trivial mitral valve regurgitation. MV peak gradient, 5.7 mmHg. The mean mitral valve gradient is 2.0 mmHg. Tricuspid Valve: The tricuspid valve is normal in  structure. Tricuspid valve regurgitation is trivial. Aortic Valve: The aortic valve is tricuspid. There is mild calcification of the aortic valve. There is mild thickening of the aortic valve. Aortic valve regurgitation is not visualized. Aortic valve sclerosis is present, with no evidence of aortic valve stenosis. Aortic valve mean gradient measures 7.0 mmHg. Aortic valve peak gradient measures 16.3 mmHg. Aortic valve area, by VTI measures 2.61 cm. Pulmonic Valve: The pulmonic valve was normal in structure. Pulmonic valve regurgitation is mild. Aorta: The aortic root is normal in size and structure. Venous: The inferior vena cava is normal in size with greater than 50% respiratory variability, suggesting right atrial pressure of 3 mmHg. IAS/Shunts: No atrial level shunt detected by color flow Doppler.  LEFT VENTRICLE PLAX 2D LVIDd:         4.90 cm     Diastology LVIDs:         3.10 cm     LV e' medial:    6.12 cm/s LV PW:         0.90 cm     LV E/e' medial:  15.5 LV IVS:        1.20 cm     LV e' lateral:   8.12 cm/s LVOT diam:     1.90 cm     LV E/e' lateral: 11.7 LV SV:         97 LV SV Index:   50 LVOT Area:     2.84 cm  LV Volumes (MOD) LV vol d, MOD A2C: 65.3 ml LV vol d, MOD A4C: 67.5 ml LV vol s, MOD A2C: 29.7 ml LV vol s, MOD A4C: 20.8 ml LV SV MOD A2C:     35.6 ml LV SV MOD A4C:     67.5 ml LV SV MOD BP:      43.5 ml RIGHT VENTRICLE RV Basal diam:  3.20 cm RV Mid diam:    2.10 cm RV S prime:     16.50 cm/s TAPSE (M-mode): 2.8 cm LEFT ATRIUM  Index        RIGHT ATRIUM           Index LA diam:        3.80 cm 1.97 cm/m   RA Area:     13.20 cm LA Vol (A2C):   54.7 ml 28.37 ml/m  RA Volume:   30.30 ml  15.72 ml/m LA Vol (A4C):   62.9 ml 32.62 ml/m LA Biplane Vol: 58.1 ml 30.13 ml/m  AORTIC VALVE                     PULMONIC VALVE AV Area (Vmax):    2.12 cm      PV Vmax:       1.04 m/s AV Area (Vmean):   2.14 cm      PV Peak grad:  4.3 mmHg AV Area (VTI):     2.61 cm AV Vmax:            202.00 cm/s AV Vmean:          124.000 cm/s AV VTI:            0.370 m AV Peak Grad:      16.3 mmHg AV Mean Grad:      7.0 mmHg LVOT Vmax:         151.00 cm/s LVOT Vmean:        93.500 cm/s LVOT VTI:          0.341 m LVOT/AV VTI ratio: 0.92  AORTA Ao Root diam: 3.00 cm MITRAL VALVE               TRICUSPID VALVE MV Area (PHT): 2.81 cm    TR Peak grad:   36.7 mmHg MV Area VTI:   2.40 cm    TR Vmax:        303.00 cm/s MV Peak grad:  5.7 mmHg MV Mean grad:  2.0 mmHg    SHUNTS MV Vmax:       1.19 m/s    Systemic VTI:  0.34 m MV Vmean:      71.9 cm/s   Systemic Diam: 1.90 cm MV Decel Time: 270 msec MV E velocity: 94.60 cm/s MV A velocity: 93.60 cm/s MV E/A ratio:  1.01 Laurance Flatten MD Electronically signed by Laurance Flatten MD Signature Date/Time: 01/16/2021/5:39:40 PM    Final     Microbiology: Recent Results (from the past 240 hour(s))  Resp Panel by RT-PCR (Flu A&B, Covid) Nasopharyngeal Swab     Status: None   Collection Time: 01/15/21  8:45 PM   Specimen: Nasopharyngeal Swab; Nasopharyngeal(NP) swabs in vial transport medium  Result Value Ref Range Status   SARS Coronavirus 2 by RT PCR NEGATIVE NEGATIVE Final    Comment: (NOTE) SARS-CoV-2 target nucleic acids are NOT DETECTED.  The SARS-CoV-2 RNA is generally detectable in upper respiratory specimens during the acute phase of infection. The lowest concentration of SARS-CoV-2 viral copies this assay can detect is 138 copies/mL. A negative result does not preclude SARS-Cov-2 infection and should not be used as the sole basis for treatment or other patient management decisions. A negative result may occur with  improper specimen collection/handling, submission of specimen other than nasopharyngeal swab, presence of viral mutation(s) within the areas targeted by this assay, and inadequate number of viral copies(<138 copies/mL). A negative result must be combined with clinical observations, patient history, and epidemiological information.  The expected result is Negative.  Fact Sheet for Patients:  BloggerCourse.com  Fact Sheet  for Healthcare Providers:  SeriousBroker.it  This test is no t yet approved or cleared by the [place] and  has been authorized for detection and/or diagnosis of SARS-CoV-2 by FDA under an Emergency Use Authorization (EUA). This EUA will remain  in effect (meaning this test can be used) for the duration of the COVID-19 declaration under Section 564(b)(1) of the Act, 21 U.S.C.section 360bbb-3(b)(1), unless the authorization is terminated  or revoked sooner.       Influenza A by PCR NEGATIVE NEGATIVE Final   Influenza B by PCR NEGATIVE NEGATIVE Final    Comment: (NOTE) The Xpert Xpress SARS-CoV-2/FLU/RSV plus assay is intended as an aid in the diagnosis of influenza from Nasopharyngeal swab specimens and should not be used as a sole basis for treatment. Nasal washings and aspirates are unacceptable for Xpert Xpress SARS-CoV-2/FLU/RSV testing.  Fact Sheet for Patients: BloggerCourse.com  Fact Sheet for Healthcare Providers: SeriousBroker.it  This test is not yet approved or cleared by the [place] FDA and has been authorized for detection and/or diagnosis of SARS-CoV-2 by FDA under an Emergency Use Authorization (EUA). This EUA will remain in effect (meaning this test can be used) for the duration of the COVID-19 declaration under Section 564(b)(1) of the Act, 21 U.S.C. section 360bbb-3(b)(1), unless the authorization is terminated or revoked.  Performed at [facility], [address] [place], [place] [address]      Labs: Basic Metabolic Panel: Recent Labs  Lab 01/15/21 1643 01/16/21 0528 01/18/21 0513 01/19/21 0505  NA 142 143 141 141  K 4.1 4.1 3.7 3.8  CL 106 109 108 107  CO2 GLUCOSE 115* 86 95 97  BUN CREATININE 1.32* 1.08* 1.29*  1.45*  CALCIUM 9.6 9.3 8.4* 8.2*  MG  --  1.8 1.9  --    Liver Function Tests: Recent Labs  Lab 01/16/21 0528  AST 22  ALT 15  ALKPHOS 43  BILITOT 1.2  PROT 5.7*  ALBUMIN 3.1*   No results for input(s): LIPASE, AMYLASE in the last 168 hours. No results for input(s): AMMONIA in the last 168 hours. CBC: Recent Labs  Lab 01/15/21 1643 01/16/21 0528  WBC 8.9 6.8  NEUTROABS  --  3.9  HGB 12.1 10.8*  HCT 39.0 35.4*  MCV 92.4 93.2  PLT 224 184   Cardiac Enzymes: No results for input(s): CKTOTAL, CKMB, CKMBINDEX, TROPONINI in the last 168 hours. BNP: Invalid input(s): POCBNP CBG: No results for input(s): GLUCAP in the last 168 hours.  Time coordinating discharge:  36 minutes  Signed:  Catarina Hartshorn, DO [facility] Pager: [phone] 01/19/2021, 11:43 AM

## 2021-01-19 NOTE — Progress Notes (Signed)
IV removed and discharge instructions reviewed.  Script sent to pharmacy.  Son here to pick up.

## 2021-01-19 NOTE — Plan of Care (Signed)

## 2021-01-19 NOTE — Care Management Important Message (Signed)
Important Message  Patient Details  Name: Destiny Barajas MRN: [id number] Date of Birth: March 29, 1940   Medicare Important Message Given:  Yes     Corey Harold 01/19/2021, 1:50 PM

## 2022-01-17 IMAGING — CT CT ANGIO CHEST
2 of 6 series · 19 of 46 positions shown · IV contrast (Omnipaque or Isovue)
Comparison: None.

CLINICAL DATA: PE suspected positive D-dimer

EXAM:
CT ANGIOGRAPHY CHEST WITH CONTRAST
TECHNIQUE: Multidetector CT imaging of the chest was performed using the
standard protocol during bolus administration of intravenous
contrast. Multiplanar CT image reconstructions and MIPs were
obtained to evaluate the vascular anatomy.
CONTRAST:  100mL OMNIPAQUE IOHEXOL 350 MG/ML SOLN

[Series 6: pe axial thins · axial · 0.57mm/px · z∈[+1350,+1566]mm · 16 of 296 slices shown]
[im 13/296  lung]
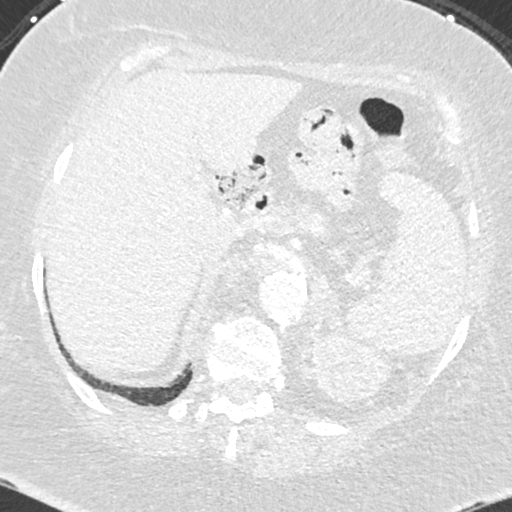
[im 37/296  soft-tissue]
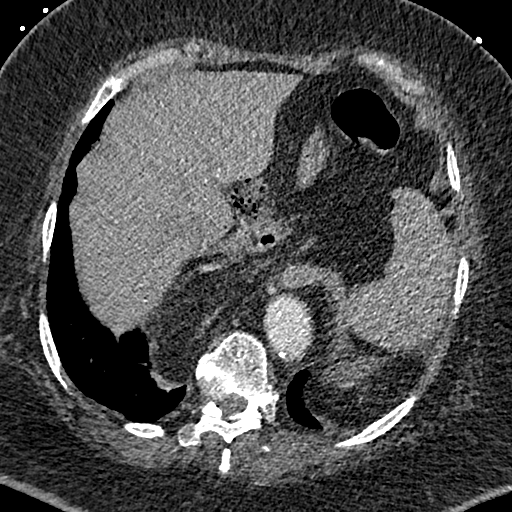
[im 50/296  lung]
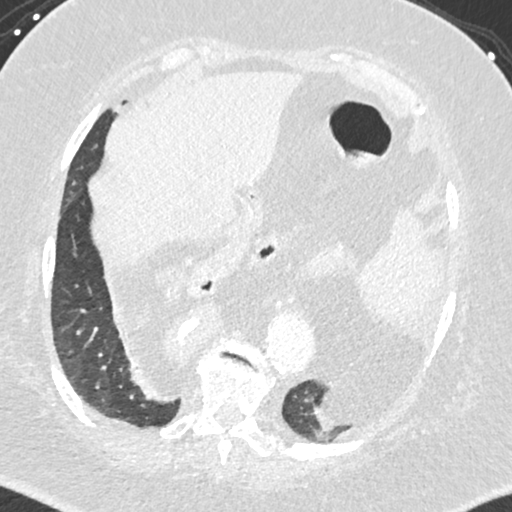
[im 74/296  soft-tissue]
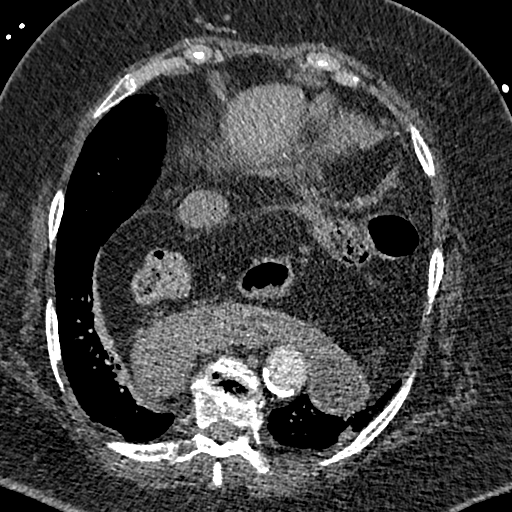
[im 87/296  lung]
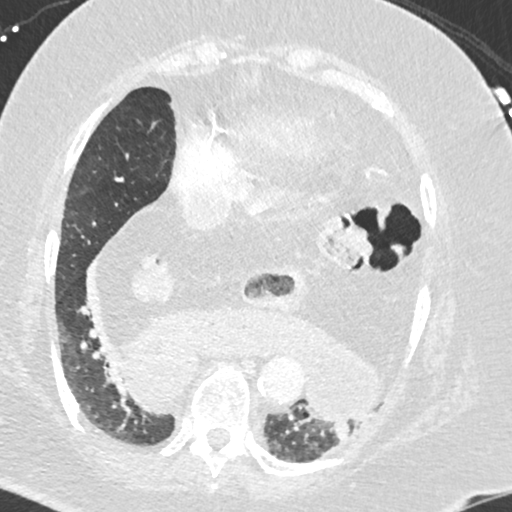
[im 99/296  soft-tissue]
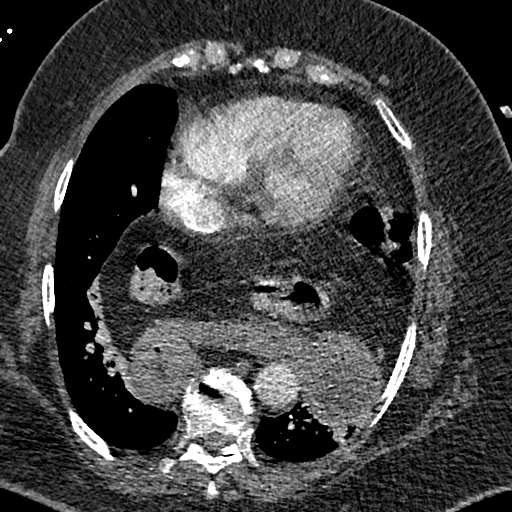
[im 123/296  lung]
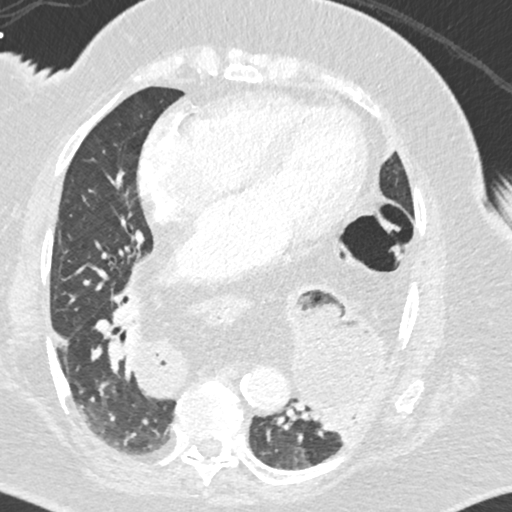
[im 136/296  soft-tissue]
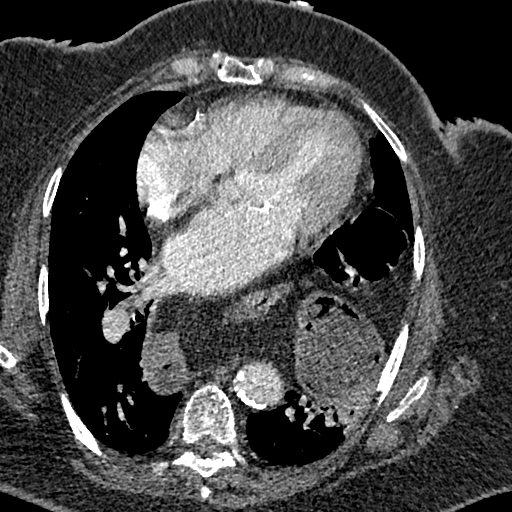
[im 160/296  lung]
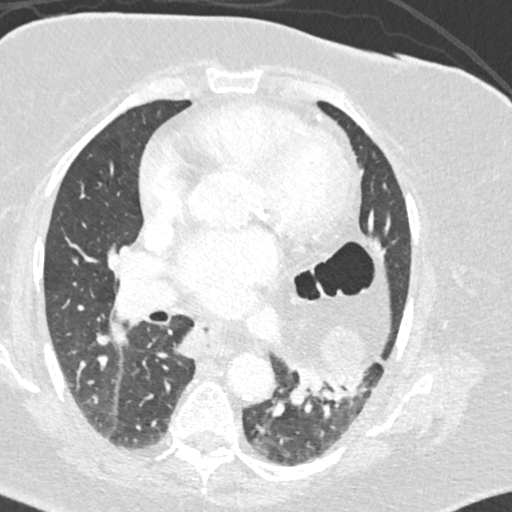
[im 173/296  soft-tissue]
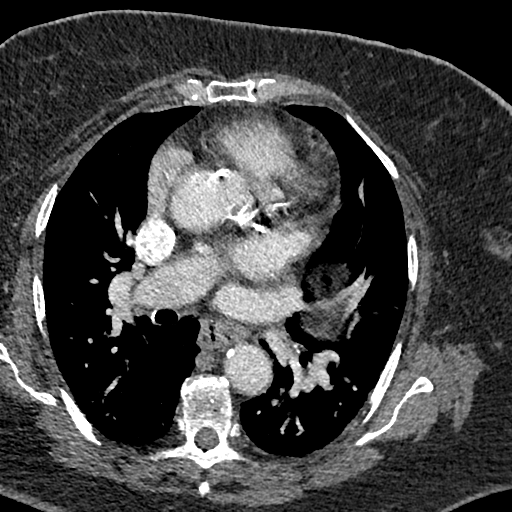
[im 197/296  lung]
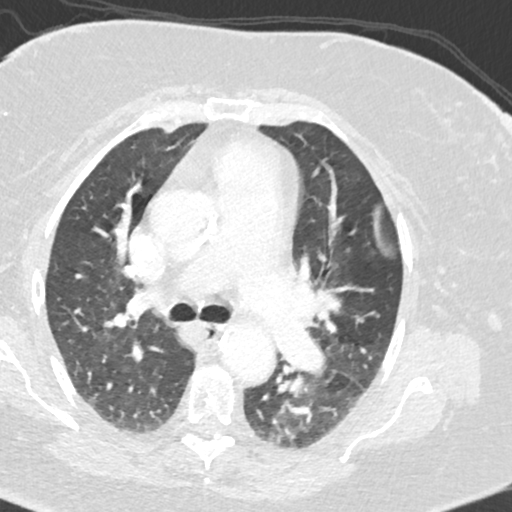
[im 209/296  soft-tissue]
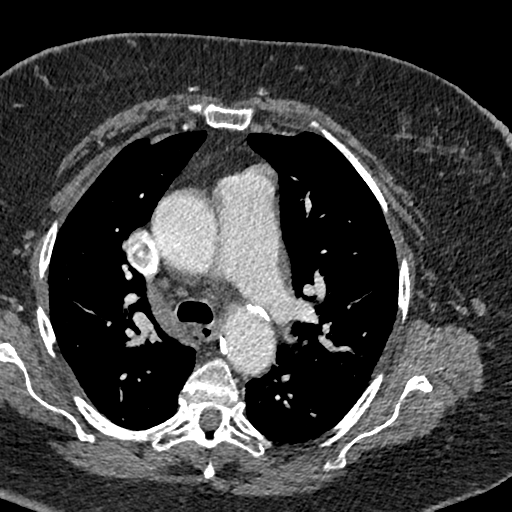
[im 222/296  lung]
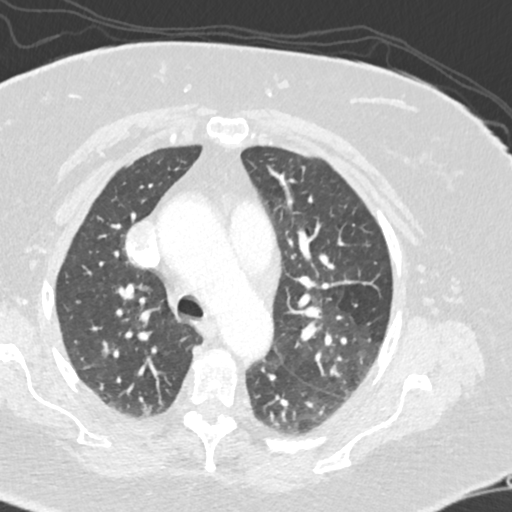
[im 246/296  soft-tissue]
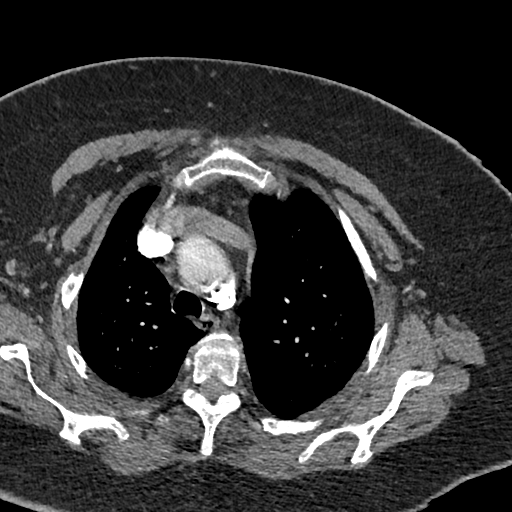
[im 259/296  lung]
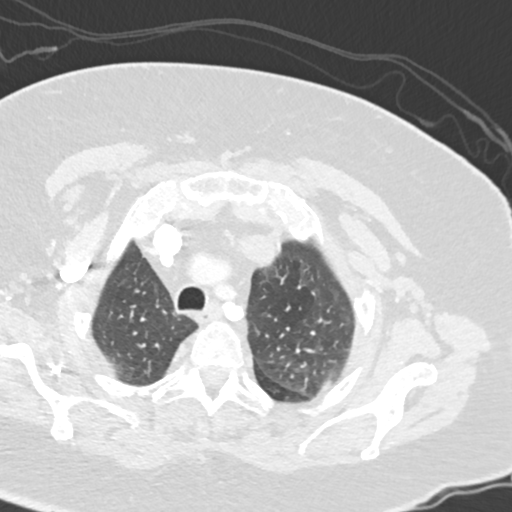
[im 283/296  soft-tissue]
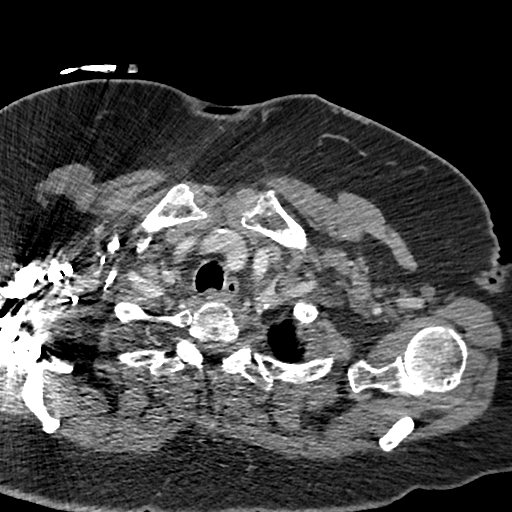

[Series 9: cor soft · coronal · 0.49mm/px · 3 of 139 slices shown]
[im 35/139  soft-tissue]
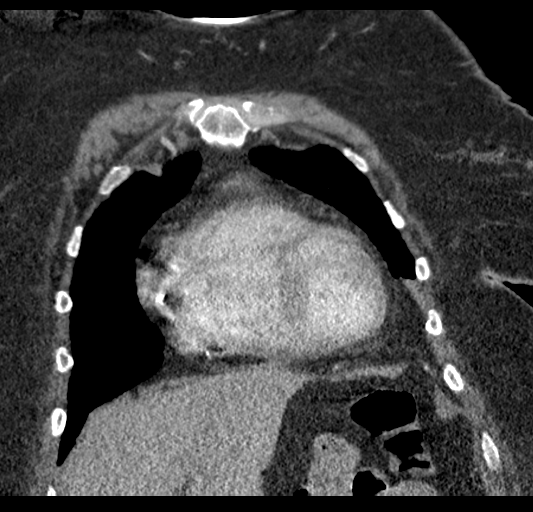
[im 70/139  soft-tissue]
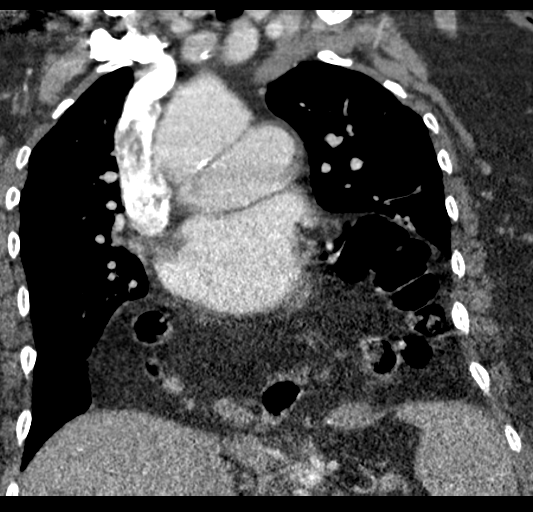
[im 104/139  soft-tissue]
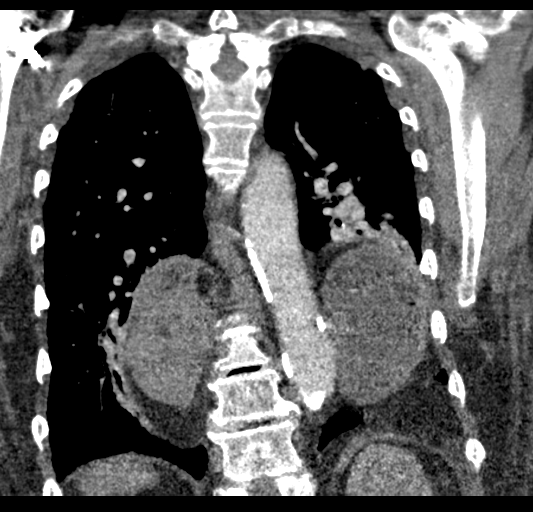

[19 of 46 positions shown; findings below may reference images not displayed]

FINDINGS: Cardiovascular: No filling defects within the pulmonary arteries to
suggest acute pulmonary embolism. Suboptimal exam due to body
habitus.

Mediastinum/Nodes: No axillary or supraclavicular adenopathy. No
mediastinal or hilar adenopathy. No pericardial fluid. Esophagus
normal.

Large diaphragmatic hernia with the near entirety of the stomach and
the transverse colon above the hemidiaphragms posterior the heart.

Lungs/Pleura: No pulmonary infarction. No pulmonary edema. Mild LEFT
basilar atelectasis associated to the elevated hemidiaphragm.

Upper Abdomen: Limited view of the liver, kidneys, pancreas are
unremarkable. Normal adrenal glands. Large hiatal hernia as above.

Musculoskeletal: No aggressive osseous lesion identified.

Review of the MIP images confirms the above findings.
IMPRESSION: 1. No acute pulmonary embolism. Suboptimal exam due to body habitus.
2. Large diaphragmatic hernia with the entirety of the stomach and
the transverse colon in the mediastinum posterior to the heart.
